# Patient Record
Sex: Male | Born: 1970 | Race: White | Hispanic: No | Marital: Married | Smoking: Former smoker
Health system: Southern US, Community
[De-identification: ages and names within clinical notes are randomized; demographics above are authoritative.]

## PROBLEM LIST (undated history)

## (undated) DIAGNOSIS — F329 Major depressive disorder, single episode, unspecified: Secondary | ICD-10-CM

## (undated) DIAGNOSIS — F32A Depression, unspecified: Secondary | ICD-10-CM

## (undated) DIAGNOSIS — T8484XA Pain due to internal orthopedic prosthetic devices, implants and grafts, initial encounter: Secondary | ICD-10-CM

## (undated) DIAGNOSIS — M109 Gout, unspecified: Secondary | ICD-10-CM

## (undated) DIAGNOSIS — G8929 Other chronic pain: Secondary | ICD-10-CM

## (undated) DIAGNOSIS — R4586 Emotional lability: Secondary | ICD-10-CM

## (undated) DIAGNOSIS — M545 Low back pain, unspecified: Secondary | ICD-10-CM

## (undated) DIAGNOSIS — R748 Abnormal levels of other serum enzymes: Secondary | ICD-10-CM

## (undated) DIAGNOSIS — T40601A Poisoning by unspecified narcotics, accidental (unintentional), initial encounter: Secondary | ICD-10-CM

## (undated) DIAGNOSIS — Z98811 Dental restoration status: Secondary | ICD-10-CM

## (undated) DIAGNOSIS — IMO0002 Reserved for concepts with insufficient information to code with codable children: Secondary | ICD-10-CM

## (undated) DIAGNOSIS — F1010 Alcohol abuse, uncomplicated: Secondary | ICD-10-CM

## (undated) DIAGNOSIS — K219 Gastro-esophageal reflux disease without esophagitis: Secondary | ICD-10-CM

## (undated) HISTORY — DX: Alcohol abuse, uncomplicated: F10.10

## (undated) HISTORY — PX: EYE MUSCLE SURGERY: SHX370

## (undated) HISTORY — DX: Abnormal levels of other serum enzymes: R74.8

## (undated) HISTORY — PX: WISDOM TOOTH EXTRACTION: SHX21

## (undated) HISTORY — DX: Gout, unspecified: M10.9

## (undated) HISTORY — DX: Poisoning by unspecified narcotics, accidental (unintentional), initial encounter: T40.601A

---

## 2003-06-05 ENCOUNTER — Encounter: Admission: RE | Admit: 2003-06-05 | Discharge: 2003-06-05 | Payer: Self-pay | Admitting: Family Medicine

## 2004-09-09 ENCOUNTER — Inpatient Hospital Stay (HOSPITAL_COMMUNITY): Admission: EM | Admit: 2004-09-09 | Discharge: 2004-09-11 | Payer: Self-pay | Admitting: *Deleted

## 2004-10-09 ENCOUNTER — Encounter: Admission: RE | Admit: 2004-10-09 | Discharge: 2004-10-09 | Payer: Self-pay | Admitting: Sports Medicine

## 2004-10-09 ENCOUNTER — Encounter: Admission: RE | Admit: 2004-10-09 | Discharge: 2004-10-09 | Payer: Self-pay | Admitting: Family Medicine

## 2004-11-10 ENCOUNTER — Encounter: Admission: RE | Admit: 2004-11-10 | Discharge: 2004-11-10 | Payer: Self-pay | Admitting: Family Medicine

## 2005-04-02 ENCOUNTER — Emergency Department (HOSPITAL_COMMUNITY): Admission: EM | Admit: 2005-04-02 | Discharge: 2005-04-02 | Payer: Self-pay | Admitting: Emergency Medicine

## 2005-08-23 ENCOUNTER — Emergency Department (HOSPITAL_COMMUNITY): Admission: EM | Admit: 2005-08-23 | Discharge: 2005-08-24 | Payer: Self-pay | Admitting: Emergency Medicine

## 2005-08-26 ENCOUNTER — Encounter: Admission: RE | Admit: 2005-08-26 | Discharge: 2005-08-26 | Payer: Self-pay | Admitting: Family Medicine

## 2005-10-30 ENCOUNTER — Encounter: Admission: RE | Admit: 2005-10-30 | Discharge: 2005-10-30 | Payer: Self-pay | Admitting: Family Medicine

## 2006-11-18 ENCOUNTER — Encounter: Admission: RE | Admit: 2006-11-18 | Discharge: 2006-11-18 | Payer: Self-pay | Admitting: Family Medicine

## 2008-05-16 ENCOUNTER — Encounter
Admission: RE | Admit: 2008-05-16 | Discharge: 2008-08-14 | Payer: Self-pay | Admitting: Physical Medicine & Rehabilitation

## 2008-05-21 ENCOUNTER — Ambulatory Visit: Payer: Self-pay | Admitting: Physical Medicine & Rehabilitation

## 2008-06-25 ENCOUNTER — Ambulatory Visit: Payer: Self-pay | Admitting: Physical Medicine & Rehabilitation

## 2008-07-29 ENCOUNTER — Encounter: Admission: RE | Admit: 2008-07-29 | Discharge: 2008-07-29 | Payer: Self-pay | Admitting: Orthopedic Surgery

## 2008-08-20 ENCOUNTER — Encounter
Admission: RE | Admit: 2008-08-20 | Discharge: 2008-08-20 | Payer: Self-pay | Admitting: Physical Medicine & Rehabilitation

## 2010-07-13 ENCOUNTER — Encounter: Payer: Self-pay | Admitting: Family Medicine

## 2010-08-01 ENCOUNTER — Inpatient Hospital Stay (INDEPENDENT_AMBULATORY_CARE_PROVIDER_SITE_OTHER)
Admission: RE | Admit: 2010-08-01 | Discharge: 2010-08-01 | Disposition: A | Discharge status: Home / Self Care | Payer: Self-pay | Source: Ambulatory Visit | Attending: Family Medicine | Admitting: Family Medicine

## 2010-08-01 ENCOUNTER — Ambulatory Visit (INDEPENDENT_AMBULATORY_CARE_PROVIDER_SITE_OTHER): Payer: Self-pay

## 2010-08-01 DIAGNOSIS — J069 Acute upper respiratory infection, unspecified: Secondary | ICD-10-CM

## 2010-11-04 NOTE — Consult Note (Signed)
REQUESTED BY:  Cristi Loron, MD   Consult requested for evaluation of low back pain.   HISTORY:  A 40 year old male who gives a history of age 48 following  backward while squatting with onset of back pain at that time.  He has  had chiropractic treatments in the past which was not particularly  helpful.  He feels like his pain has gotten worse for the last 1-1/2  years and because of low back pain, he has not exercised as much, he is  not walking as much, and has gained more weight which he feels is  exacerbating his pain complaints.     He had undergone a neurosurgical evaluation in 2007.  An MRI was  reviewed at that time.  He was not felt to be a good surgical candidate.   He states his average pain score is 8-9/10.  Pain is sharp, constant,  interferes with activity at 7/10 level.  His sleep is fair.  Pain is  worse with bending, sitting, and standing.  He really does better if he  can alternate between sitting and standing and his pain improves with  rest and medications.  He is really feeling fair.  He can walk without  assistance close to 5-10 minutes at that time.  He will climb steps and  drives.  He works 40 hours a week in [employer] a month and a half  to two months.  This involves a lot of driving.   He needs some assistance with daily shopping and household duties.   His review of systems is positive for weakness, numbness, as well as  weight gain.   He has been on Vicodin 5 mg every 4-5 hours per his report taking  10/325, which she is written down, although he told me the 5 mg dose.  Dr. Paulino Rily has him on Xanax 0.5 daily, Ambien 10 mg daily, and Prozac  20 mg daily.   His allergies are none.   PAST MEDICAL HISTORY:  He did have an ED visit in 2007 for  unresponsiveness.  EMS was called and he was brought in.  He was on the  verge of intubation when he came to.  He had a urine drug screen  positive for cocaine, positive for ethanol at 185.  He states  that this  was not a recurrent problem and that he was having a hard time with his  mother's death.  He states that he drinks 2-3 times a week, but only has  about 2 drinks at each sitting.  He is married and lives with his wife.  He smokes a pack per day.  We did discuss this as well.   PHYSICAL EXAMINATION:  Blood pressure 140/84, respirations 18, and O2  sat 97% on room air.   Opioid risk total score within 8 putting him at the lower end of high  risk.   Oswestry disability index score 50%.   Examination, obese male in no acute stress.  Orientation x3.  Affect is  alert.  Gait is normal.  He has abnormal pain behaviors grimacing  excessively with changes of position and going from sitting to standing  or from lying to sitting.  Also, pain behaviors when testing is upper  extremity strength, his lower back is hurting him with that.   His upper extremity strength is 5/5, lower extremity strength is 5/5.  Deep tendon reflexes are normal bilaterally upper and lower extremities.  Range of motion is normal  bilateral upper and lower extremities.  Favors  is negative.  Extremities show no evidence of atrophy or fasciculations.  Sensation is normal in the upper and lower extremities.  Neck range of  motion normal.  Back range of motion is abnormal.  He has reduced range  of motion 50% with forward flexion and 50% of normal or extension of his  lumbar spine.   IMPRESSION:  Chronic low back pain likely combination of diskogenic and  some facet arthropathy with some myofascial component as well.   As I indicated with the patient, we will trial him on tramadol given his  past history as well as the need for driving.  Also, I have written for  physical therapy to give him some spine stabilization exercises, to  progress to home exercise program.   Urine drug screen today in the event that we prescribed narcotic  analgesics, I discussed with the patient that he is a high-risk patient  and  would need to be monitored carefully and overall good one to try to  stay away from narcotic analgesics or maybe use them only for a flareup.   Thank you for this interesting consultation.  I will keep you apprised  of his progress.  We discussed injections as well.  He states that he is  afraid of needles, but overall may be part of this treatment.      Erick Colace, M.D.  Electronically Signed     AEK/MedQ  D:05/21/2008 17:01:06  T:05/22/2008 06:35:47  Job #:  [id number]   cc:   Cristi Loron, M.D.  Fax: [phone]   Emeterio Reeve, MD  Fax: [phone]

## 2010-11-04 NOTE — Procedures (Signed)
NAME:  Andre Gutierrez, MCBURNEY NO.:  [id number]   MEDICAL RECORD NO.:  [id number]          PATIENT TYPE:  REC   LOCATION:  TPC                          FACILITY:  [facility]   PHYSICIAN:  Erick Colace, M.D.DATE OF BIRTH:  December 22, 1970   DATE OF PROCEDURE:  06/25/2008  DATE OF DISCHARGE:                               OPERATIVE REPORT   REFERRING PHYSICIAN:  Cristi Loron, MD   A 39 year old male with back pain since age 40 following backward on a  squatting rack.  No relief with chiropractic treatments.  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  Sees Dr. Paulino Rily for his  hypertension and anxiety.   His past medical history also is significant for ED visit in 2007 for  unresponsiveness, EMS called, urine positive for cocaine and ethanol.  Denies any recurrence of any this usage.  He states this was due to his  mother's death.   He has had an early run out on his medications, had about 21 left 6 days  go and has been taking about 4 a day rather than 3 a day as prescribed.  He feels like he has some muscle spasms at night and needs to take 1 at  night for that reason.  He has had no other new medical problems in the  interval time.  He has had a urine drug screen at this office on  May 22, 2008, which was appropriate for the medications that he is  taking.   He still drinks on the weekends and on 2 weekend days a week, he will  drink 3-4 drinks.  I did cautioned him that this can have a negative  interaction in terms of respiratory effect and he was not aware of that.   He has Oswestry disability index score today of 48%.   PHYSICAL EXAMINATION:  GENERAL:  No acute distress.  Mood and affect  appropriate.  Affect is alert.  Gait is normal, although he is stiff  when he first gets up.  VITAL SIGNS:  His blood pressure 165/96, pulse 83, respirations 18, O2  sat 98% room air.  EXTREMITIES:  Without edema.  Deep tendon reflexes are reduced right  ankle, otherwise normal lower extremities.  Lower extremity range of  motion is full.  He has 4/5 strength bilateral quads, 5/5 bilateral  ankle dorsiflexors and plantar flexors.   His extensor strength is reduced secondary to pain.   Extremities showed no fasciculations or atrophy.   IMPRESSION:  Chronic back pain likely combination of diskogenic and  facet arthropathy, myofascial component as well.  We will continue his  hydrocodone.  I reiterated no alcohol with this medication, potential  interaction, and has told him that I do not feel comfortable escalating  this dosage.  For the nighttime muscle spasms, we will start  cyclobenzaprine 10 mg.  I have given some exercises to do for back  stretching.  Also, he is to continue his walking program and followup  with his primary care on weight loss as well as blood pressure.      Greig Castilla  Burman Riis, M.D.  Electronically Signed     AEK/MEDQ  D:  06/25/2008 10:42:15  T:  06/26/2008 00:13:12  Job:  [id number]

## 2010-11-07 NOTE — Discharge Summary (Signed)
NAME:  Andre Gutierrez, BUCHMANN NO.:  [id number]   MEDICAL RECORD NO.:  [id number]          PATIENT TYPE:  INP   LOCATION:  5502                         FACILITY:  [facility]   PHYSICIAN:  Jackie Plum, M.D.DATE OF BIRTH:  April 27, 1971   DATE OF ADMISSION:  09/08/2004  DATE OF DISCHARGE:  09/11/2004                                 DISCHARGE SUMMARY   PRIMARY CARE PHYSICIAN:  Dr. Julian Reil.   DISCHARGE DIAGNOSES:  1.  Community-acquired pneumonia.  2.  History of gastrointestinal reflux disease.  3.  Mild normocytic anemia during hospitalization with an admission      hemoglobin of 13.0 and a discharge hemoglobin of 11.7.  Outpatient      followup recommended.  4.  Mild thrombocytopenia with discharge thrombocyte count of 421 secondary      to infectious disease, i.e. pneumonia.  Outpatient followup recommended.   DISCHARGE MEDICATIONS:  The patient is to go home on:  1.  Zithromax 250 mg daily for two days.  2.  Ceftin 500 mg twice daily for seven days.  3.  Humibid LA 600 mg tablet two tablets twice daily.  4.  Tussionex 5 mL b.i.d.  5.  Protonix 40 mg daily.   DISCHARGE LABORATORIES:  WBC 7.7, hemoglobin 11.7, hematocrit 34.2, MCV  99.7, platelet count 421.  Sodium 137, potassium 3.5, chloride 102, CO2 26,  glucose 103, BUN 3, creatinine 1.0, calcium 8.2.   DIAGNOSTIC WORKUP OF NOTE:  1.  X-ray on admission indicated patchy airspace opacities bilaterally,      mainly on the left, with some of these being vaguely nodular on the      left, which is reminiscent of __________ pneumonia possibly.  (The      patient will need repeat x-ray in about three to four weeks and if this      is persistent, followup CT would need to be done per PCP.)  2.  Urinary Legionella antigen done on September 09, 2004, and reported on September 10, 2004, was negative.  Blood cultures taken on September 08, 2004, and      reported on September 10, 2004, had not grown any organism yet.  This is a  preliminary report.  (This will need to be followed up with final report      at outpatient level.)   CONSULTANTS:  Not applicable.   PROCEDURES:  Not applicable.   CONDITION ON DISCHARGE:  Improved and satisfactory.   REASON FOR ADMISSION/HOSPITAL COURSE:  The patient presented with weakness,  fatigue, cough, fever and chills of two weeks' duration.  He had been given  Tamiflu and antibiotic for 10 days without any improvement.  On presentation  to the emergency room, he had not been able to eat well with decreased  appetite.  According to the H&P by Theone Stanley, M.D., on presentation,  the patient's temperature was 99.2 degrees Fahrenheit initially, but had  gone up to 102.1 degrees Fahrenheit.  His pulse rate was 101 with a  respiratory rate of 20 and O2 saturation of 97% on 3 L by oxygen by  nasal  cannula.  He was said to be ill appearing and his eyes were somewhat sunken,  but he was not in any acute distress.  Cardiac exam was unremarkable.  Lung  auscultation revealed mild expiratory wheezes mainly on the right side.  His  neurologic exam was unremarkable.  He had a leukocytosis of 11,100 with  normal hemoglobin and hematocrit.  X-rays showed ill-defined airspace  opacities as mentioned above.  He was therefore admitted for community-  acquired pneumonia with outpatient failed treatment and started on  antibiotics for this.  The patient was admitted to the hospitalist service.  IV antibiotics were started on Zithromax and Rocephin.  The patient had  apparently been treated with Levaquin previously and failed this treatment.  He was started on nebulizers and antitussives with expectorants.  With these  measures, the patient's overall clinical condition improved.  He felt  stronger.  Had been able to ambulate in his room and on the unit without any  problems.  His O2 saturation done this afternoon on room air was 92% with  ambulation .  He had been afebrile for two days.  He  is deemed appropriate  for discharge today.  On rounds today, the patient is hemodynamically  stable.  His mucous membranes are moist.  He is not in any acute  cardiopulmonary distress.  His lung exam is improved with improved air entry  and no wheezes.  He had scattered rhonchi only.  Cardiac exam unremarkable,  regular, no gallops.  Abdomen soft and nontender.  Extremities with no  cyanosis and no edema.  CNS exam is nonfocal.  Laboratory work as listed  above.  He is going to be discharged home to complete antibiotic treatment  as noted above.  He will need followup with his PCP, at which time he will  need repeat imaging as noted above.   This afternoon, I discussed the patient's plan of care with the patient's  spouse at bedside.  All of their questions were satisfactorily answered and  he is good to go.      GO/MEDQ  D:  09/11/2004  T:  09/11/2004  Job:  [id number]   cc:   Dr. Julian Reil

## 2010-11-07 NOTE — H&P (Signed)
NAME:  Andre Gutierrez, Andre Gutierrez NO.:  [id number]   MEDICAL RECORD NO.:  [id number]          PATIENT TYPE:  EMS   LOCATION:  [facility]JO                         FACILITY:  [facility]   PHYSICIAN:  Theone Stanley, MD   DATE OF BIRTH:  1971-01-19   DATE OF ADMISSION:  09/08/2004  DATE OF DISCHARGE:                                HISTORY & PHYSICAL   CHIEF COMPLAINT:  Weakness.   HISTORY OF PRESENT ILLNESS:  Andre Gutierrez is a very pleasant 40 year old  gentleman presenting with continued symptoms of fatigue, intermittent cough,  fever and chills over the past two weeks.  His history starts about two  weeks ago.  He had gone to [place] at that time, and he stated that it was  cold and he subsequently went in the hot tub, and he felt that he might have  gone to sleep wet.  He woke up on Saturday feeling quite ill.  At that point  in time both he and his girlfriend were feeling bad.  They were able to see  Andre Gutierrez, their physician.  Although he was negative for flu, his  girlfriend was positive for flu so he was treated both for flu and  pneumonia.  He was given Tamiflu and some antibiotics for a 10-day course.  It is unclear what time.  It has been related to me that he was on Levaquin.  He states that his cough, which was quite severe originally, has now  somewhat subsided and mainly it is okay when he is lying still.  It is a dry  cough, nonproductive.  He does have fever and chills, increasing shortness  of breath with movement, no dizziness.  He has been unable to eat.  He has  had nausea and vomiting with his oral intake; however, he was given some  medication for that and he is able to keep stuff down currently.  He denies  any change in his urinating or bowel habits.  No weakness, no headache, no  lightheadedness, no dizziness.  No intolerance to heat or cold.   PAST MEDICAL HISTORY:  1.  Mainly pains.  He has chronic knee pain and chronic shoulder pain.  2.  GERD.   MEDICATIONS:  1.  Aciphex daily.  2.  Vicodin on a p.r.n. basis.  He, however, has been taking it quite      frequently recently, up to six to eight days a day, and that is 10/500      mg.  3.  Tussionex.  Again, he is taking this quite regularly; now he just takes      it at night.  4.  Xanax is on a p.r.n. basis.   ALLERGIES:  No known drug allergies.   SOCIAL HISTORY:  The patient lives in [place].  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  He is single, no children.  Smokes a pack a day for the past  year and a half.  Prior to that he was chewing snuff, but he has not been  doing that for quite some time.  He drinks six to eight drinks  a day of  wine, liquor and sometimes beer.  He does not feel this is in excess.  No  illicit drug use.   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   FAMILY HISTORY:  None.   REVIEW OF SYSTEMS:  Pleas see HPI, otherwise all systems are negative.   PHYSICAL EXAMINATION:  VITAL SIGNS:  Temperature originally 99.2.  He did  have a fever of 102.1.  Blood pressure 112/60, pulse of 101, respiratory  rate of 20.  While he has oxygen on at about 2-3 L, he is 97%; however, when  he takes it off he goes into the mid-80s and he drops it even further when  he ambulates.  GENERAL:  Andre Gutierrez is a very pleasant 40 year old who does appear ill.  His eyes are somewhat sunken in, although he was not in any distress.  HEENT:  Head normocephalic, atraumatic.  Eyes:  Pupils equal, reactive to  light.  Extraocular movements intact.  Ears with no discharge.  Throat:  Clear.  Nasal mucosa moist.  NECK:  Supple, no lymphadenopathy.  CARDIAC:  Regular rate and rhythm.  No murmurs, rubs or gallops appreciated.  LUNGS:  Clear.  He had mild expiratory wheezing mainly on the right side,  otherwise I did not appreciate any abnormalities.  ABDOMEN:  Soft, nontender.  EXTREMITIES:  No edema, cyanosis or clubbing.  GENITOURINARY:   Deferred.  SKIN:  Skin appeared to be warm.  He was clammy and he had sweat on his  back, otherwise no rashes appreciated.  NEUROLOGIC:  The patient is alert and oriented x3.   LABORATORY DATA:  White count of 11, hemoglobin of 13, hematocrit of 38.  Platelets at 422.  Sodium 132, potassium 3.7, chloride at 98, BUN at 12,  creatinine at 1.6.  Chest x-ray showed ill-defined air space opacities in  the left lower lung consistent with pneumonia, consider atypical.  The  patient had two blood cultures drawn.  These are pending.   ASSESSMENT AND PLAN:  A 40 year old gentleman presenting with pneumonia, who  has failed outpatient treatment.   1.  Pneumonia.  This could be atypical; however, if he was on Levaquin, this      should have covered atypical.  The question is whether he might have a      resistant Streptococcus or some other bacteria that Levaquin was not      covering.  Will put him on azithromycin and Rocephin at this point in      time, follow up on his blood cultures, and I will obtain a Legionella      urine antigen assay.  2.  Hypoxia.  This is secondary to #1.  He will need to be on oxygen, and      there is a possibility he will need to be discharged on this.  Will ask      case management to get involved with that.  3.  Chronic pains.  The patient apparently has chronic pain issues in his      knees and his shoulder.  He normally takes Vicodin at home.  However, to      see whether his fevers are defervescing, will switch him to oxycodone      and Tylenol on a p.r.n. basis so that we can monitor this.  4.  Gastroesophageal reflux disease.  Will continue on Protonix 40 mg p.o.      daily.  5.  Prophylaxis.  He will be on a PPI, and we will start him on Lovenox.      AEJ/MEDQ  D:  09/09/2004  T:  09/09/2004  Job:  [id number]   cc:   Andre Gutierrez, M.D.  [address]  [place], [place] [address]  Fax: [phone]

## 2010-12-12 ENCOUNTER — Emergency Department (HOSPITAL_COMMUNITY)
Admission: EM | Admit: 2010-12-12 | Discharge: 2010-12-12 | Disposition: A | Discharge status: Home / Self Care | Payer: Self-pay | Attending: Emergency Medicine | Admitting: Emergency Medicine

## 2010-12-12 DIAGNOSIS — H5710 Ocular pain, unspecified eye: Secondary | ICD-10-CM | POA: Insufficient documentation

## 2010-12-12 DIAGNOSIS — H109 Unspecified conjunctivitis: Secondary | ICD-10-CM | POA: Insufficient documentation

## 2011-01-29 ENCOUNTER — Emergency Department (HOSPITAL_COMMUNITY)
Admission: EM | Admit: 2011-01-29 | Discharge: 2011-01-30 | Disposition: A | Discharge status: Home / Self Care | Payer: Self-pay | Attending: Emergency Medicine | Admitting: Emergency Medicine

## 2011-01-29 ENCOUNTER — Emergency Department (HOSPITAL_COMMUNITY): Payer: Self-pay

## 2011-01-29 DIAGNOSIS — S91309A Unspecified open wound, unspecified foot, initial encounter: Secondary | ICD-10-CM | POA: Insufficient documentation

## 2011-01-29 DIAGNOSIS — Z09 Encounter for follow-up examination after completed treatment for conditions other than malignant neoplasm: Secondary | ICD-10-CM | POA: Insufficient documentation

## 2011-01-29 DIAGNOSIS — W268XXA Contact with other sharp object(s), not elsewhere classified, initial encounter: Secondary | ICD-10-CM | POA: Insufficient documentation

## 2011-12-23 ENCOUNTER — Emergency Department (HOSPITAL_COMMUNITY)
Admission: EM | Admit: 2011-12-23 | Discharge: 2011-12-23 | Disposition: A | Discharge status: Rehabilitation Facility | Payer: Self-pay | Attending: Emergency Medicine | Admitting: Emergency Medicine

## 2011-12-23 ENCOUNTER — Encounter (HOSPITAL_COMMUNITY): Payer: Self-pay | Admitting: Emergency Medicine

## 2011-12-23 DIAGNOSIS — F1010 Alcohol abuse, uncomplicated: Secondary | ICD-10-CM | POA: Insufficient documentation

## 2011-12-23 LAB — URINALYSIS, ROUTINE W REFLEX MICROSCOPIC
Glucose, UA: NEGATIVE mg/dL
Hgb urine dipstick: NEGATIVE
Ketones, ur: 40 mg/dL — AB
Nitrite: NEGATIVE
Protein, ur: 100 mg/dL — AB
Specific Gravity, Urine: >1.046 — ABNORMAL HIGH (ref 1.005–1.030)
Urobilinogen, UA: 1 mg/dL (ref 0.0–1.0)
pH: 5.5 (ref 5.0–8.0)

## 2011-12-23 LAB — CBC WITH DIFFERENTIAL/PLATELET
Basophils Absolute: 0 10*3/uL (ref 0.0–0.1)
Basophils Relative: 0 % (ref 0–1)
Eosinophils Absolute: 0 10*3/uL (ref 0.0–0.7)
Eosinophils Relative: 0 % (ref 0–5)
HCT: 44.4 % (ref 39.0–52.0)
Hemoglobin: 15.9 g/dL (ref 13.0–17.0)
Lymphocytes Relative: 34 % (ref 12–46)
Lymphs Abs: 1.7 10*3/uL (ref 0.7–4.0)
MCH: 35.8 pg — ABNORMAL HIGH (ref 26.0–34.0)
MCHC: 35.8 g/dL (ref 30.0–36.0)
MCV: 100 fL (ref 78.0–100.0)
Monocytes Absolute: 0.5 10*3/uL (ref 0.1–1.0)
Monocytes Relative: 9 % (ref 3–12)
Neutro Abs: 2.8 10*3/uL (ref 1.7–7.7)
Neutrophils Relative %: 56 % (ref 43–77)
Platelets: 110 10*3/uL — ABNORMAL LOW (ref 150–400)
RBC: 4.44 MIL/uL (ref 4.22–5.81)
RDW: 14.7 % (ref 11.5–15.5)
WBC: 4.9 10*3/uL (ref 4.0–10.5)

## 2011-12-23 LAB — URINE MICROSCOPIC-ADD ON

## 2011-12-23 LAB — COMPREHENSIVE METABOLIC PANEL
ALT: 88 U/L — ABNORMAL HIGH (ref 0–53)
AST: 124 U/L — ABNORMAL HIGH (ref 0–37)
Albumin: 4.6 g/dL (ref 3.5–5.2)
Alkaline Phosphatase: 95 U/L (ref 39–117)
BUN: 9 mg/dL (ref 6–23)
CO2: 21 mEq/L (ref 19–32)
Calcium: 9.8 mg/dL (ref 8.4–10.5)
Chloride: 97 mEq/L (ref 96–112)
Creatinine, Ser: 0.86 mg/dL (ref 0.50–1.35)
GFR calc Af Amer: >90 mL/min (ref >90)
GFR calc non Af Amer: >90 mL/min (ref >90)
Glucose, Bld: 125 mg/dL — ABNORMAL HIGH (ref 70–99)
Potassium: 3.4 mEq/L — ABNORMAL LOW (ref 3.5–5.1)
Sodium: 137 mEq/L (ref 135–145)
Total Bilirubin: 1.2 mg/dL (ref 0.3–1.2)
Total Protein: 8 g/dL (ref 6.0–8.3)

## 2011-12-23 LAB — RAPID URINE DRUG SCREEN, HOSP PERFORMED
Amphetamines: NOT DETECTED
Barbiturates: NOT DETECTED
Benzodiazepines: POSITIVE — AB
Cocaine: NOT DETECTED
Opiates: NOT DETECTED
Tetrahydrocannabinol: NOT DETECTED

## 2011-12-23 LAB — ETHANOL: Alcohol, Ethyl (B): 16 mg/dL — ABNORMAL HIGH (ref 0–11)

## 2011-12-23 MED ORDER — IBUPROFEN 600 MG PO TABS: 600.0000 mg | ORAL_TABLET | Freq: Three times a day (TID) | ORAL | Status: DC | PRN

## 2011-12-23 MED ORDER — THIAMINE HCL 100 MG/ML IJ SOLN: 100.0000 mg | Freq: Every day | INTRAMUSCULAR | Status: DC

## 2011-12-23 MED ORDER — NICOTINE 21 MG/24HR TD PT24: 21.0000 mg | MEDICATED_PATCH | Freq: Every day | TRANSDERMAL | Status: DC

## 2011-12-23 MED ORDER — ADULT MULTIVITAMIN W/MINERALS CH
1.0000 | ORAL_TABLET | Freq: Every day | ORAL | Status: DC
  Administered 2011-12-23: 1 via ORAL
  Filled 2011-12-23: qty 1

## 2011-12-23 MED ORDER — LORAZEPAM 1 MG PO TABS
0.0000 mg | ORAL_TABLET | Freq: Four times a day (QID) | ORAL | Status: DC
  Administered 2011-12-23: 2 mg via ORAL
  Administered 2011-12-23: 1 mg via ORAL
  Filled 2011-12-23 (×2): qty 1
  Filled 2011-12-23: qty 2

## 2011-12-23 MED ORDER — POTASSIUM CHLORIDE CRYS ER 20 MEQ PO TBCR
20.0000 meq | EXTENDED_RELEASE_TABLET | Freq: Once | ORAL | Status: AC
  Administered 2011-12-23: 20 meq via ORAL
  Filled 2011-12-23: qty 1

## 2011-12-23 MED ORDER — FOLIC ACID 1 MG PO TABS
1.0000 mg | ORAL_TABLET | Freq: Every day | ORAL | Status: DC
  Administered 2011-12-23: 1 mg via ORAL
  Filled 2011-12-23: qty 1

## 2011-12-23 MED ORDER — ZOLPIDEM TARTRATE 5 MG PO TABS: 5.0000 mg | ORAL_TABLET | Freq: Every evening | ORAL | Status: DC | PRN

## 2011-12-23 MED ORDER — LORAZEPAM 1 MG PO TABS: 1.0000 mg | ORAL_TABLET | Freq: Four times a day (QID) | ORAL | Status: DC | PRN

## 2011-12-23 MED ORDER — LORAZEPAM 2 MG/ML IJ SOLN: 1.0000 mg | Freq: Four times a day (QID) | INTRAMUSCULAR | Status: DC | PRN

## 2011-12-23 MED ORDER — LORAZEPAM 1 MG PO TABS: 0.0000 mg | ORAL_TABLET | Freq: Two times a day (BID) | ORAL | Status: DC

## 2011-12-23 MED ORDER — ALUM & MAG HYDROXIDE-SIMETH 200-200-20 MG/5ML PO SUSP: 30.0000 mL | ORAL | Status: DC | PRN

## 2011-12-23 MED ORDER — ACETAMINOPHEN 325 MG PO TABS: 650.0000 mg | ORAL_TABLET | ORAL | Status: DC | PRN

## 2011-12-23 MED ORDER — VITAMIN B-1 100 MG PO TABS
100.0000 mg | ORAL_TABLET | Freq: Every day | ORAL | Status: DC
  Administered 2011-12-23: 100 mg via ORAL
  Filled 2011-12-23: qty 1

## 2011-12-23 MED ORDER — ONDANSETRON HCL 4 MG PO TABS: 4.0000 mg | ORAL_TABLET | Freq: Three times a day (TID) | ORAL | Status: DC | PRN

## 2011-12-23 NOTE — ED Notes (Signed)
Pt states he was sent her by [facility] for med clearance.  Wants detox from alcohol.  Denies SI/HI/AVH.

## 2011-12-23 NOTE — ED Notes (Addendum)
[facility] has confirmed the availability of a bed for this pt at [facility]. Pt discharged to company of wife and is reportedly going to [facility] now. Departs in safe and stable condition.

## 2011-12-23 NOTE — ED Notes (Signed)
1 pt belonging bag in locker 43

## 2011-12-23 NOTE — ED Provider Notes (Signed)
History     CSN: [id number]  Arrival date & time 12/23/11  1059   First MD Initiated Contact with Patient 12/23/11 1101      No chief complaint on file.   (Consider location/radiation/quality/duration/timing/severity/associated sxs/prior treatment) HPI   Pt  Sent from [facility] for detox from alcohol. He was told he could go to his PCP or come to [facility] to have medical clearance. He states that he's been drinking since he was 41 years old and has tried detox before that accidentally went back to have it. The patient is also on Suboxone for opiate abuse and chronic pain. The patient admits to feeling very shaky and I am visibly able to see him tremor. He denies having any delusions, depression, suicidal ideation, homicidal ideation. The patient states that [facility]  told him that they have a bed for him. The patient's a little tachycardic at 103 and blood pressure is 172/102. He is currently having some mild tremors. He is in no acute distress at this time.   Past Medical History  Diagnosis Date  . ETOH abuse     Past Surgical History  Procedure Date  . Eye surgery     No family history on file.  History  Substance Use Topics  . Smoking status: Never Smoker   . Smokeless tobacco: Current User  . Alcohol Use: Yes      Review of Systems   HEENT: denies blurry vision or change in hearing PULMONARY: Denies difficulty breathing and SOB CARDIAC: denies chest pain or heart palpitations MUSCULOSKELETAL:  denies being unable to ambulate ABDOMEN AL: denies abdominal pain GU: denies loss of bowel or urinary control NEURO: denies numbness and tingling in extremities SKIN: no new rashes PSYCH: patient denies anxiety or depression. NECK: Pt denies having neck pain    Allergies  Review of patient's allergies indicates no known allergies.  Home Medications  No current outpatient prescriptions on file.  BP 172/102  Pulse 103  Temp 98.2 F (36.8 C) (Oral)  Resp 22  Ht 6\' 1"   (1.854 m)  Wt 275 lb (124.739 kg)  BMI 36.28 kg/m2  SpO2 98%  Physical Exam  Nursing note and vitals reviewed. Constitutional: He appears well-developed and well-nourished. No distress.  HENT:  Head: Normocephalic and atraumatic.  Eyes: Pupils are equal, round, and reactive to light.  Neck: Normal range of motion. Neck supple.  Cardiovascular: Normal rate and regular rhythm.   Pulmonary/Chest: Effort normal.  Abdominal: Soft.  Neurological: He is alert.  Skin: Skin is warm and dry.  Psychiatric: He has a normal mood and affect. His speech is normal. He is not aggressive and not withdrawn. Thought content is not paranoid and not delusional. He expresses no homicidal ideation. He expresses no homicidal plans.       pts is having tremors from alcohol withdrawals    ED Course  Procedures (including critical care time)   Labs Reviewed  URINE RAPID DRUG SCREEN (HOSP PERFORMED)  URINALYSIS, ROUTINE W REFLEX MICROSCOPIC  ETHANOL  CBC WITH DIFFERENTIAL  COMPREHENSIVE METABOLIC PANEL   No results found.   1. Alcohol abuse       MDM  Pt given 2mg  Ativan in ED per Alcohol withdrawal protocol.    [facility] will be consulted.      Dorthula Matas, PA 12/23/11 1118

## 2011-12-23 NOTE — ED Notes (Signed)
Patient's wife brought patient a bag of clothes. Clothes placed in locker 43.

## 2011-12-23 NOTE — ED Notes (Signed)
Pt wanded by security 1 bag of belongings. [employer]. $21.00 cash and 1 apple phone. t-shirt, shorts, shoes.

## 2011-12-23 NOTE — ED Notes (Signed)
Patient aware of need for urine specimen. Patient unable to void at this time. Patient given label specimen cup. Encouraged to call for assistance if needed.  Wants a cup of water.

## 2011-12-23 NOTE — ED Provider Notes (Signed)
History     CSN: [id number]  Arrival date & time 12/23/11  1059   First MD Initiated Contact with Patient 12/23/11 1101      No chief complaint on file.   (Consider location/radiation/quality/duration/timing/severity/associated sxs/prior treatment) HPI  Past Medical History  Diagnosis Date  . ETOH abuse     Past Surgical History  Procedure Date  . Eye surgery     No family history on file.  History  Substance Use Topics  . Smoking status: Never Smoker   . Smokeless tobacco: Current User  . Alcohol Use: Yes      Review of Systems  Allergies  Review of patient's allergies indicates no known allergies.  Home Medications   Current Outpatient Rx  Name Route Sig Dispense Refill  . BUPRENORPHINE HCL-NALOXONE HCL 8-2 MG SL FILM Sublingual Place 4 Film under the tongue 2 (two) times daily.    Marland Kitchen FLUOXETINE HCL 20 MG PO CAPS Oral Take 20 mg by mouth every morning.      BP 172/102  Pulse 102  Temp 98.2 F (36.8 C) (Oral)  Resp 22  Ht 6\' 1"  (1.854 m)  Wt 275 lb (124.739 kg)  BMI 36.28 kg/m2  SpO2 98%  Physical Exam  ED Course  Procedures (including critical care time)  Labs Reviewed  URINE RAPID DRUG SCREEN (HOSP PERFORMED) - Abnormal; Notable for the following:    Benzodiazepines POSITIVE (*)     All other components within normal limits  URINALYSIS, ROUTINE W REFLEX MICROSCOPIC - Abnormal; Notable for the following:    Color, Urine ORANGE (*)  BIOCHEMICALS MAY BE AFFECTED BY COLOR   Specific Gravity, Urine >1.046 (*)     Bilirubin Urine SMALL (*)     Ketones, ur 40 (*)     Protein, ur 100 (*)     Leukocytes, UA SMALL (*)     All other components within normal limits  ETHANOL - Abnormal; Notable for the following:    Alcohol, Ethyl (B) 16 (*)     All other components within normal limits  CBC WITH DIFFERENTIAL - Abnormal; Notable for the following:    MCH 35.8 (*)     Platelets 110 (*)     All other components within normal limits  COMPREHENSIVE  METABOLIC PANEL - Abnormal; Notable for the following:    Potassium 3.4 (*)     Glucose, Bld 125 (*)     AST 124 (*)     ALT 88 (*)     All other components within normal limits  URINE MICROSCOPIC-ADD ON - Abnormal; Notable for the following:    Squamous Epithelial / LPF FEW (*)     Bacteria, UA FEW (*)     All other components within normal limits   No results found.   1. Alcohol abuse       MDM  Medical screening examination/treatment/procedure(s) were conducted as a shared visit with non-physician practitioner(s) and myself.  I personally evaluated the patient during the encounter Pt with etoh abuse, requesting detox/rehab program. Alert, content. No tremor or shakes. Act eval.         Suzi Roots, MD 12/23/11 [id number]

## 2011-12-23 NOTE — ED Provider Notes (Signed)
Medical screening examination/treatment/procedure(s) were conducted as a shared visit with non-physician practitioner(s) and myself.  I personally evaluated the patient during the encounter   Suzi Roots, MD 12/23/11 1537

## 2011-12-23 NOTE — ED Provider Notes (Signed)
  Physical Exam  BP 142/98  Pulse 96  Temp 98 F (36.7 C) (Oral)  Resp 18  Ht 6\' 1"  (1.854 m)  Wt 275 lb (124.739 kg)  BMI 36.28 kg/m2  SpO2 97%  Physical Exam  ED Course  Procedures  MDM Patient has been accepted at [facility] and his wife is coming to pick him up.      Juliet Rude. Rubin Payor, MD 12/23/11 2047

## 2011-12-23 NOTE — ED Notes (Signed)
Family at bedside. 

## 2011-12-23 NOTE — BH Assessment (Signed)
Assessment Note   Andre Gutierrez is an 41 y.o. male. Requesting detox from ETOH. Reports drinking approx 1/5 liquor daily since March 2013 with slight increase at the last several months. Denies other SA. Denies SI, HI or psychosis. States he is on Suboxone maintenance for former opiate abuse & chronic pain and is followed by Dr. Dub Mikes for this.. Stated was at [facility] July 2012 and had one other relapse during that time. Pt stated last drink was approx 9AM 12/23/11. Reports anxiety, sweats, tremors and a tendency for his blood pressure to increase with detox.  Axis I: Alcohol Dependence Axis II: Deferred Axis III:  Past Medical History  Diagnosis Date  . ETOH abuse    Axis IV: occupational problems, other psychosocial or environmental problems and problems with primary support group Axis V: 41-50 serious symptoms  Past Medical History:  Past Medical History  Diagnosis Date  . ETOH abuse     Past Surgical History  Procedure Date  . Eye surgery     Family History: No family history on file.  Social History:  reports that he has never smoked. He uses smokeless tobacco. He reports that he drinks alcohol. His drug history not on file.  Additional Social History:  Alcohol / Drug Use Pain Medications: N/A Prescriptions: See PTA List Over the Counter: Benadryl History of alcohol / drug use?: Yes Longest period of sobriety (when/how long): 1 moth Withdrawal Symptoms: Agitation;Change in blood pressure;Fever / Chills;Nausea / Vomiting;Tremors Substance #1 Name of Substance 1: ETOH 1 - Age of First Use: 17 1 - Amount (size/oz): 1/5 liquor 1 - Frequency: daily 1 - Duration: since March 2013 1 - Last Use / Amount: 9 AM  CIWA: CIWA-Ar BP: 172/102 mmHg Pulse Rate: 102  Nausea and Vomiting: mild nausea with no vomiting Tactile Disturbances: none Tremor: two Auditory Disturbances: not present Paroxysmal Sweats: two Visual Disturbances: very mild sensitivity Anxiety: two Headache,  Fullness in Head: none present Agitation: somewhat more than normal activity Orientation and Clouding of Sensorium: oriented and can do serial additions CIWA-Ar Total: 5  COWS:    Allergies: No Known Allergies  Home Medications:  (Not in a hospital admission)  OB/GYN Status:  No LMP for male patient.  General Assessment Data Location of Assessment: [facility] ED Living Arrangements: Alone Can pt return to current living arrangement?: Yes Admission Status: Voluntary Is patient capable of signing voluntary admission?: Yes Transfer from: Acute Hospital Referral Source: Self/Family/Friend  Education Status Is patient currently in school?: No Highest grade of school patient has completed: College  Risk to self Suicidal Ideation: No Suicidal Intent: No Is patient at risk for suicide?: No Suicidal Plan?: No Access to Means: No What has been your use of drugs/alcohol within the last 12 months?: 1/5 liquor daily since March 2013 Previous Attempts/Gestures: No How many times?: 0  Other Self Harm Risks: N.A Intentional Self Injurious Behavior: None Family Suicide History: Unknown Recent stressful life event(s): Other (Comment);Recent negative physical changes (Waking up shaking & becoming more dependent on ETOH) Persecutory voices/beliefs?: No Depression: No Depression Symptoms: Insomnia Substance abuse history and/or treatment for substance abuse?: Yes Suicide prevention information given to non-admitted patients: Not applicable  Risk to Others Homicidal Ideation: No Thoughts of Harm to Others: No Current Homicidal Intent: No Current Homicidal Plan: No Access to Homicidal Means: No Identified Victim: N/A History of harm to others?: No Assessment of Violence: None Noted Violent Behavior Description: Anxious, cooperative Does patient have access to weapons?: No Criminal Charges  Pending?: No Does patient have a court date: No  Psychosis Hallucinations: None noted Delusions:  None noted  Mental Status Report Appear/Hygiene: Other (Comment) (unremarkable) Eye Contact: Good Motor Activity: Restlessness;Freedom of movement Speech: Logical/coherent Level of Consciousness: Alert Mood: Anxious Affect: Anxious Anxiety Level: Minimal Thought Processes: Coherent;Relevant Judgement: Unimpaired Orientation: Person;Place;Time;Situation Obsessive Compulsive Thoughts/Behaviors: None  Cognitive Functioning Concentration: Normal Memory: Recent Intact;Remote Intact IQ: Average Insight: Good Impulse Control: Fair Appetite: Poor Weight Loss: 0  Weight Gain: 0  Sleep: Decreased Total Hours of Sleep: 4  (will take OTC to help sleep) Vegetative Symptoms: None  ADLScreening [facility] Assessment Services) Patient's cognitive ability adequate to safely complete daily activities?: Yes Patient able to express need for assistance with ADLs?: Yes Independently performs ADLs?: Yes  Abuse/Neglect [facility]) Physical Abuse: Denies Verbal Abuse: Denies Sexual Abuse: Denies  Prior Inpatient Therapy Prior Inpatient Therapy: Yes Prior Therapy Dates: July 2012 Prior Therapy Facilty/Provider(s): [facility] Reason for Treatment: Detox  Prior Outpatient Therapy Prior Outpatient Therapy: Yes Prior Therapy Dates: Current Prior Therapy Facilty/Provider(s): Dr. Dub Mikes for suboxone maintenance/checks Reason for Treatment: Suboxone  ADL Screening (condition at time of admission) Patient's cognitive ability adequate to safely complete daily activities?: Yes Patient able to express need for assistance with ADLs?: Yes Independently performs ADLs?: Yes Weakness of Legs: None Weakness of Arms/Hands: None       Abuse/Neglect Assessment (Assessment to be complete while patient is alone) Physical Abuse: Denies Verbal Abuse: Denies Sexual Abuse: Denies Exploitation of patient/patient's resources: Denies Self-Neglect: Denies Values / Beliefs Cultural Requests During Hospitalization:  None Spiritual Requests During Hospitalization: None   Advance Directives (For Healthcare) Advance Directive: Patient does not have advance directive;Patient would not like information Pre-existing out of facility DNR order (yellow form or pink MOST form): No    Additional Information 1:1 In Past 12 Months?: No CIRT Risk: No Elopement Risk: No Does patient have medical clearance?: Yes     Disposition:  Disposition Disposition of Patient: Referred to [facility]) Patient referred to: [facility]  On Site Evaluation by:   Reviewed with Physician:     Romeo Apple 12/23/2011 6:03 PM

## 2011-12-23 NOTE — ED Notes (Signed)
Pt called [facility] this morning for detox from alcohol-Vodka. Pt has been treated before and says "it has been a roller coaster". Today pt is anxious and shaking. Pt reports last drink this am. Pt denies SI/HI.

## 2012-07-22 ENCOUNTER — Other Ambulatory Visit: Payer: Self-pay | Admitting: Orthopedic Surgery

## 2012-07-22 DIAGNOSIS — M79671 Pain in right foot: Secondary | ICD-10-CM

## 2012-07-25 ENCOUNTER — Other Ambulatory Visit: Payer: Self-pay | Admitting: Orthopedic Surgery

## 2012-07-25 ENCOUNTER — Ambulatory Visit
Admission: RE | Admit: 2012-07-25 | Discharge: 2012-07-25 | Disposition: A | Discharge status: Home / Self Care | Payer: BC Managed Care – PPO | Source: Ambulatory Visit | Attending: Orthopedic Surgery | Admitting: Orthopedic Surgery

## 2012-07-25 DIAGNOSIS — M79671 Pain in right foot: Secondary | ICD-10-CM

## 2012-08-23 ENCOUNTER — Encounter (HOSPITAL_BASED_OUTPATIENT_CLINIC_OR_DEPARTMENT_OTHER): Payer: Self-pay | Admitting: *Deleted

## 2012-08-23 NOTE — Progress Notes (Signed)
Pt had hx etoh abuse-denies now-hx gerd-no meds-recent physical-called for notes-probably has sleep apnea-no study-staying overnight-to bring all meds and overnight bag

## 2012-08-23 NOTE — Progress Notes (Signed)
08/23/12 1307  OBSTRUCTIVE SLEEP APNEA  Have you ever been diagnosed with sleep apnea through a sleep study? No

## 2012-08-24 ENCOUNTER — Other Ambulatory Visit: Payer: Self-pay | Admitting: Orthopedic Surgery

## 2012-08-25 ENCOUNTER — Ambulatory Visit (HOSPITAL_COMMUNITY): Payer: BC Managed Care – PPO

## 2012-08-25 ENCOUNTER — Encounter (HOSPITAL_BASED_OUTPATIENT_CLINIC_OR_DEPARTMENT_OTHER): Payer: Self-pay | Admitting: Certified Registered Nurse Anesthetist

## 2012-08-25 ENCOUNTER — Ambulatory Visit (HOSPITAL_BASED_OUTPATIENT_CLINIC_OR_DEPARTMENT_OTHER)
Admission: RE | Admit: 2012-08-25 | Discharge: 2012-08-25 | Disposition: A | Discharge status: Home / Self Care | Payer: BC Managed Care – PPO | Source: Ambulatory Visit | Attending: Orthopedic Surgery | Admitting: Orthopedic Surgery

## 2012-08-25 ENCOUNTER — Ambulatory Visit (HOSPITAL_BASED_OUTPATIENT_CLINIC_OR_DEPARTMENT_OTHER): Payer: BC Managed Care – PPO | Admitting: Certified Registered Nurse Anesthetist

## 2012-08-25 ENCOUNTER — Encounter (HOSPITAL_BASED_OUTPATIENT_CLINIC_OR_DEPARTMENT_OTHER)
Admission: RE | Disposition: A | Discharge status: Home / Self Care | Payer: Self-pay | Source: Ambulatory Visit | Attending: Orthopedic Surgery

## 2012-08-25 DIAGNOSIS — M12579 Traumatic arthropathy, unspecified ankle and foot: Secondary | ICD-10-CM | POA: Insufficient documentation

## 2012-08-25 DIAGNOSIS — M6240 Contracture of muscle, unspecified site: Secondary | ICD-10-CM | POA: Insufficient documentation

## 2012-08-25 DIAGNOSIS — S92251K Displaced fracture of navicular [scaphoid] of right foot, subsequent encounter for fracture with nonunion: Secondary | ICD-10-CM

## 2012-08-25 DIAGNOSIS — IMO0002 Reserved for concepts with insufficient information to code with codable children: Secondary | ICD-10-CM | POA: Insufficient documentation

## 2012-08-25 HISTORY — PX: GASTROC RECESSION EXTREMITY: SHX6262

## 2012-08-25 HISTORY — PX: FOOT ARTHRODESIS: SHX1655

## 2012-08-25 HISTORY — DX: Gastro-esophageal reflux disease without esophagitis: K21.9

## 2012-08-25 HISTORY — DX: Major depressive disorder, single episode, unspecified: F32.9

## 2012-08-25 HISTORY — DX: Depression, unspecified: F32.A

## 2012-08-25 SURGERY — FUSION, JOINT, FOOT
Anesthesia: Regional | Site: Leg Lower | Laterality: Right | Wound class: Clean

## 2012-08-25 MED ORDER — OXYCODONE HCL 5 MG PO TABS
5.0000 mg | ORAL_TABLET | Freq: Once | ORAL | Status: AC | PRN
  Administered 2012-08-25: 5 mg via ORAL

## 2012-08-25 MED ORDER — BACITRACIN ZINC 500 UNIT/GM EX OINT
TOPICAL_OINTMENT | CUTANEOUS | Status: DC | PRN
  Administered 2012-08-25: 1 via TOPICAL

## 2012-08-25 MED ORDER — PROPOFOL 10 MG/ML IV BOLUS
INTRAVENOUS | Status: DC | PRN
  Administered 2012-08-25: 300 mg via INTRAVENOUS

## 2012-08-25 MED ORDER — HYDROMORPHONE HCL 2 MG PO TABS: 2.0000 mg | ORAL_TABLET | ORAL | Status: DC | PRN

## 2012-08-25 MED ORDER — FENTANYL CITRATE 0.05 MG/ML IJ SOLN
50.0000 ug | INTRAMUSCULAR | Status: DC | PRN
  Administered 2012-08-25 (×2): 100 ug via INTRAVENOUS

## 2012-08-25 MED ORDER — HYDROMORPHONE HCL PF 1 MG/ML IJ SOLN
0.2500 mg | INTRAMUSCULAR | Status: DC | PRN
  Administered 2012-08-25 (×2): 0.5 mg via INTRAVENOUS

## 2012-08-25 MED ORDER — FENTANYL CITRATE 0.05 MG/ML IJ SOLN
INTRAMUSCULAR | Status: DC | PRN
  Administered 2012-08-25 (×4): 25 ug via INTRAVENOUS
  Administered 2012-08-25: 50 ug via INTRAVENOUS

## 2012-08-25 MED ORDER — SODIUM CHLORIDE 0.9 % IV SOLN: INTRAVENOUS | Status: DC

## 2012-08-25 MED ORDER — PROMETHAZINE HCL 25 MG/ML IJ SOLN: 6.2500 mg | INTRAMUSCULAR | Status: DC | PRN

## 2012-08-25 MED ORDER — 0.9 % SODIUM CHLORIDE (POUR BTL) OPTIME
TOPICAL | Status: DC | PRN
  Administered 2012-08-25: 300 mL

## 2012-08-25 MED ORDER — MEPERIDINE HCL 25 MG/ML IJ SOLN: 6.2500 mg | INTRAMUSCULAR | Status: DC | PRN

## 2012-08-25 MED ORDER — ONDANSETRON HCL 4 MG/2ML IJ SOLN
INTRAMUSCULAR | Status: DC | PRN
  Administered 2012-08-25: 4 mg via INTRAVENOUS

## 2012-08-25 MED ORDER — BUPIVACAINE-EPINEPHRINE PF 0.5-1:200000 % IJ SOLN
INTRAMUSCULAR | Status: DC | PRN
  Administered 2012-08-25: 30 mL

## 2012-08-25 MED ORDER — LIDOCAINE HCL (CARDIAC) 20 MG/ML IV SOLN
INTRAVENOUS | Status: DC | PRN
  Administered 2012-08-25: 60 mg via INTRAVENOUS

## 2012-08-25 MED ORDER — MIDAZOLAM HCL 5 MG/5ML IJ SOLN
INTRAMUSCULAR | Status: DC | PRN
  Administered 2012-08-25: 2 mg via INTRAVENOUS

## 2012-08-25 MED ORDER — CHLORHEXIDINE GLUCONATE 4 % EX LIQD: 60.0000 mL | Freq: Once | CUTANEOUS | Status: DC

## 2012-08-25 MED ORDER — OXYCODONE HCL 5 MG/5ML PO SOLN: 5.0000 mg | Freq: Once | ORAL | Status: AC | PRN

## 2012-08-25 MED ORDER — LACTATED RINGERS IV SOLN
INTRAVENOUS | Status: DC
  Administered 2012-08-25 (×3): via INTRAVENOUS

## 2012-08-25 MED ORDER — CEFAZOLIN SODIUM-DEXTROSE 2-3 GM-% IV SOLR
2.0000 g | INTRAVENOUS | Status: AC
  Administered 2012-08-25: 3 g via INTRAVENOUS

## 2012-08-25 MED ORDER — MIDAZOLAM HCL 2 MG/2ML IJ SOLN
1.0000 mg | INTRAMUSCULAR | Status: DC | PRN
  Administered 2012-08-25 (×2): 2 mg via INTRAVENOUS

## 2012-08-25 MED ORDER — DEXAMETHASONE SODIUM PHOSPHATE 10 MG/ML IJ SOLN
INTRAMUSCULAR | Status: DC | PRN
  Administered 2012-08-25: 10 mg via INTRAVENOUS

## 2012-08-25 SURGICAL SUPPLY — 88 items
BAG DECANTER FOR FLEXI CONT (MISCELLANEOUS) IMPLANT
BANDAGE ESMARK 6X9 LF (GAUZE/BANDAGES/DRESSINGS) ×2 IMPLANT
BANDAGE GAUZE ELAST BULKY 4 IN (GAUZE/BANDAGES/DRESSINGS) IMPLANT
BIT DRILL 2.5X2.75 QC CALB (BIT) ×3 IMPLANT
BLADE AVERAGE 25X9 (BLADE) IMPLANT
BLADE MICRO SAGITTAL (BLADE) ×3 IMPLANT
BLADE OSC/SAG .038X5.5 CUT EDG (BLADE) IMPLANT
BLADE SURG 15 STRL LF DISP TIS (BLADE) ×6 IMPLANT
BLADE SURG 15 STRL SS (BLADE) ×6
BNDG CMPR 9X4 STRL LF SNTH (GAUZE/BANDAGES/DRESSINGS)
BNDG CMPR 9X6 STRL LF SNTH (GAUZE/BANDAGES/DRESSINGS) ×1
BNDG COHESIVE 4X5 TAN STRL (GAUZE/BANDAGES/DRESSINGS) ×3 IMPLANT
BNDG COHESIVE 6X5 TAN STRL LF (GAUZE/BANDAGES/DRESSINGS) ×3 IMPLANT
BNDG ESMARK 4X9 LF (GAUZE/BANDAGES/DRESSINGS) IMPLANT
BNDG ESMARK 6X9 LF (GAUZE/BANDAGES/DRESSINGS) ×3
CHLORAPREP W/TINT 26ML (MISCELLANEOUS) ×3 IMPLANT
CLOTH BEACON ORANGE TIMEOUT ST (SAFETY) ×3 IMPLANT
COVER MAYO STAND STRL (DRAPES) ×3 IMPLANT
COVER SURGICAL LIGHT HANDLE (MISCELLANEOUS) ×3 IMPLANT
COVER TABLE BACK 60X90 (DRAPES) ×3 IMPLANT
CUFF TOURNIQUET SINGLE 24IN (TOURNIQUET CUFF) IMPLANT
CUFF TOURNIQUET SINGLE 34IN LL (TOURNIQUET CUFF) ×3 IMPLANT
DECANTER SPIKE VIAL GLASS SM (MISCELLANEOUS) IMPLANT
DRAPE C-ARMOR (DRAPES) ×3 IMPLANT
DRAPE EXTREMITY T 121X128X90 (DRAPE) ×3 IMPLANT
DRAPE INCISE IOBAN 66X45 STRL (DRAPES) IMPLANT
DRAPE SURG 17X23 STRL (DRAPES) IMPLANT
DRAPE U-SHAPE 47X51 STRL (DRAPES) ×3 IMPLANT
DRAPE U-SHAPE 76X120 STRL (DRAPES) IMPLANT
DRSG EMULSION OIL 3X3 NADH (GAUZE/BANDAGES/DRESSINGS) ×9 IMPLANT
DRSG PAD ABDOMINAL 8X10 ST (GAUZE/BANDAGES/DRESSINGS) ×6 IMPLANT
DRSG TEGADERM 2-3/8X2-3/4 SM (GAUZE/BANDAGES/DRESSINGS) IMPLANT
ELECT REM PT RETURN 9FT ADLT (ELECTROSURGICAL) ×3
ELECTRODE REM PT RTRN 9FT ADLT (ELECTROSURGICAL) ×2 IMPLANT
GLOVE BIO SURGEON STRL SZ8 (GLOVE) ×3 IMPLANT
GLOVE BIOGEL M STRL SZ7.5 (GLOVE) ×6 IMPLANT
GLOVE BIOGEL PI IND STRL 8 (GLOVE) ×4 IMPLANT
GLOVE BIOGEL PI INDICATOR 8 (GLOVE) ×2
GLOVE INDICATOR 7.0 STRL GRN (GLOVE) ×3 IMPLANT
GOWN PREVENTION PLUS XLARGE (GOWN DISPOSABLE) IMPLANT
GOWN PREVENTION PLUS XXLARGE (GOWN DISPOSABLE) ×6 IMPLANT
K-WIRE ACE 1.6X6 (WIRE) ×12
KWIRE ACE 1.6X6 (WIRE) ×8 IMPLANT
NDL SAFETY ECLIPSE 18X1.5 (NEEDLE) IMPLANT
NEEDLE HYPO 18GX1.5 SHARP (NEEDLE)
NEEDLE HYPO 22GX1.5 SAFETY (NEEDLE) IMPLANT
PACK BASIN DAY SURGERY FS (CUSTOM PROCEDURE TRAY) ×3 IMPLANT
PAD CAST 4YDX4 CTTN HI CHSV (CAST SUPPLIES) ×4 IMPLANT
PADDING CAST ABS 4INX4YD NS (CAST SUPPLIES)
PADDING CAST ABS COTTON 4X4 ST (CAST SUPPLIES) IMPLANT
PADDING CAST COTTON 4X4 STRL (CAST SUPPLIES) ×6
PADDING CAST COTTON 6X4 STRL (CAST SUPPLIES) ×3 IMPLANT
PENCIL BUTTON HOLSTER BLD 10FT (ELECTRODE) ×3 IMPLANT
PLATE LOCK SM LAT MIDFOOT (Plate) ×3 IMPLANT
PUTTY DBM STAGRAFT 5CC (Putty) ×3 IMPLANT
SCREW ACE CAN 4.0 50M (Screw) ×3 IMPLANT
SCREW ACE CAN 4.0 60M (Screw) ×3 IMPLANT
SCREW CORT 3.5X32 (Screw) ×3 IMPLANT
SCREW CORT T15 30X3.5XST LCK (Screw) ×2 IMPLANT
SCREW CORT T15 32X3.5XST LCK (Screw) ×2 IMPLANT
SCREW CORTICAL 3.5X30MM (Screw) ×3 IMPLANT
SCREW LOCK 3.5X32 DIST TIB (Orthopedic Implant) ×3 IMPLANT
SCREW LOCK CORT STAR 3.5X26 (Screw) ×3 IMPLANT
SCREW LOCK CORT STAR 3.5X34 (Screw) ×3 IMPLANT
SCREW NL LP 36X3.5 (Screw) ×3 IMPLANT
SHEET MEDIUM DRAPE 40X70 STRL (DRAPES) ×3 IMPLANT
SLEEVE SCD COMPRESS KNEE MED (MISCELLANEOUS) ×3 IMPLANT
SPLINT FAST PLASTER 5X30 (CAST SUPPLIES) ×20
SPLINT PLASTER CAST FAST 5X30 (CAST SUPPLIES) ×40 IMPLANT
SPONGE GAUZE 4X4 12PLY (GAUZE/BANDAGES/DRESSINGS) ×3 IMPLANT
SPONGE LAP 18X18 X RAY DECT (DISPOSABLE) ×3 IMPLANT
STAPLER VISISTAT 35W (STAPLE) IMPLANT
STOCKINETTE 6  STRL (DRAPES) ×1
STOCKINETTE 6 STRL (DRAPES) ×2 IMPLANT
STRIP CLOSURE SKIN 1/2X4 (GAUZE/BANDAGES/DRESSINGS) IMPLANT
SUCTION FRAZIER TIP 10 FR DISP (SUCTIONS) ×3 IMPLANT
SUT ETHILON 3 0 PS 1 (SUTURE) ×3 IMPLANT
SUT MNCRL AB 3-0 PS2 18 (SUTURE) ×6 IMPLANT
SUT VIC AB 0 SH 27 (SUTURE) ×3 IMPLANT
SUT VIC AB 2-0 SH 18 (SUTURE) IMPLANT
SUT VIC AB 2-0 SH 27 (SUTURE)
SUT VIC AB 2-0 SH 27XBRD (SUTURE) IMPLANT
SUT VICRYL 4-0 PS2 18IN ABS (SUTURE) IMPLANT
SYR BULB 3OZ (MISCELLANEOUS) ×3 IMPLANT
TOWEL OR 17X24 6PK STRL BLUE (TOWEL DISPOSABLE) ×3 IMPLANT
TUBE CONNECTING 20X1/4 (TUBING) ×3 IMPLANT
UNDERPAD 30X30 INCONTINENT (UNDERPADS AND DIAPERS) ×3 IMPLANT
WATER STERILE IRR 1000ML POUR (IV SOLUTION) IMPLANT

## 2012-08-25 NOTE — H&P (Signed)
Andre Gutierrez is an 42 y.o. male.   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  He also has a tight heelcord.  He presents now for arthrodesis of the talonavicular joint and gastroc recession  Past Medical History  Diagnosis Date  . ETOH abuse   . Anxiety   . Depression   . GERD (gastroesophageal reflux disease)   . Pain     hx pain knees shoulders,feet    Past Surgical History  Procedure Laterality Date  . Eye surgery      stabismus age 71yr  . Wisdom tooth extraction      History reviewed. No pertinent family history. Social History:  reports that he quit smoking about 3 years ago. He uses smokeless tobacco. He reports that  drinks alcohol. He reports that he uses illicit drugs (Amphetamines, Solvent inhalants, and Cocaine).  Allergies:  Allergies  Allergen Reactions  . Acetaminophen Nausea And Vomiting    And diarrhea    Medications Prior to Admission  Medication Sig Dispense Refill  . diphenhydrAMINE (SOMINEX) 25 MG tablet Take 25 mg by mouth at bedtime as needed for sleep.      Marland Kitchen FLUoxetine (PROZAC) 20 MG capsule Take 20 mg by mouth every morning.      Marland Kitchen ibuprofen (ADVIL,MOTRIN) 200 MG tablet Take 200 mg by mouth every 6 (six) hours as needed for pain.        Results for orders placed during the hospital encounter of Aug 30, 2012 (from the past 48 hour(s))  POCT HEMOGLOBIN-HEMACUE     Status: None   Collection Time    30-Aug-2012  7:00 AM      Result Value Range   Hemoglobin 13.9  13.0 - 17.0 g/dL   No results found.  ROS  No recent f/c/n/v/wt loss  Blood pressure 161/100, pulse 88, temperature 98.9 F (37.2 C), temperature source Oral, resp. rate 13, height 6\' 1"  (1.854 m), weight 137.44 kg (303 lb), SpO2 100.00%. Physical Exam obese male in nad.  A and O x 4.  Mood and affect normal.  EOMI.  Resp unlabored.  R foot with healthy skin.  Palpable pulses and intact sens  to LT throughout foot.  Tight heelcord.  5/5 strength in PF and Df of the ankle.  TTP at the TN joint.  Assessment/Plan Right talonavicular arthritis and tight heelcord - to OR for gastroc recession and talonavicular arthrodesis.  The risks and benefits of the alternative treatment options have been discussed in detail.  The patient wishes to proceed with surgery and specifically understands risks of bleeding, infection, nerve damage, blood clots, need for additional surgery, amputation and death.   Toni Arthurs 2012-08-30, 7:22 AM

## 2012-08-25 NOTE — Brief Op Note (Signed)
08/25/2012  10:22 AM  PATIENT:  Andre Gutierrez  42 y.o. male  PRE-OPERATIVE DIAGNOSIS:  1.  Right gastrocnemius contracture      2.  Right talonavicular joint arthritis      3.  Right navicular nonunion  POST-OPERATIVE DIAGNOSIS:  1.  Right gastrocnemius contracture      2.  Right talonavicular joint arthritis      3.  Right navicular nonunion      4.  Right naviculocuneiform joint arthritis  Procedure(s):    1.  Right gastrocnemius recession      2.  Right talonavicular joint arthrodesis      3.  Right naviculocuneiform arthrodesis      SURGEON:  Toni Arthurs, MD  ASSISTANT: n/a  ANESTHESIA:   General, regional  EBL:  minimal   TOURNIQUET:   Total Tourniquet Time Documented: Thigh (Right) - 131 minutes Total: Thigh (Right) - 131 minutes   COMPLICATIONS:  None apparent  DISPOSITION:  Extubated, awake and stable to recovery.  DICTATION ID:  [id number]

## 2012-08-25 NOTE — Anesthesia Postprocedure Evaluation (Signed)
  Anesthesia Post-op Note  Patient: Andre Gutierrez  Procedure(s) Performed: Procedure(s) with comments: Right Talonavicular Arthrodesis,  (Right) - Right Talonavicular Arthrodesis,  Right Gastroc Recession (Right)  Patient Location: PACU  Anesthesia Type:GA combined with regional for post-op pain  Level of Consciousness: awake  Airway and Oxygen Therapy: Patient Spontanous Breathing  Post-op Pain: mild  Post-op Assessment: Post-op Vital signs reviewed  Post-op Vital Signs: stable  Complications: No apparent anesthesia complications

## 2012-08-25 NOTE — Anesthesia Procedure Notes (Addendum)
Anesthesia Regional Block:  Popliteal block  Pre-Anesthetic Checklist: ,, timeout performed, Correct Patient, Correct Site, Correct Laterality, Correct Procedure, Correct Position, site marked, Risks and benefits discussed, pre-op evaluation,  At surgeon's request and post-op pain management  Laterality: Lower and Right  Prep: chloraprep       Needles:  Injection technique: Single-shot  Needle Type: Echogenic Needle      Needle Gauge: 22 and 22 G  Needle insertion depth: 6 cm   Additional Needles:  Procedures: ultrasound guided (picture in chart) and nerve stimulator Popliteal block  Nerve Stimulator or Paresthesia:  Response: Twitch elicited, 0.8 mA, 0.3 ms, 6 cm  Additional Responses:   Narrative:  Start time: 08/25/2012 7:05 AM End time: 08/25/2012 7:18 AM Injection made incrementally with aspirations every 5 mL.  Performed by: Personally  Anesthesiologist: Oren Section, MD  Additional Notes: This is a lateral approach to the popliteal fossa area. A stimulator needle is used starting at 1.5 mAmp current and descending as nerve contact is made. Injection of anesthetic is in 5 ml increments with multiple neg aspirations before continuing. Total volume is 40 cc.    Popliteal block Procedure Name: LMA Insertion Date/Time: 08/25/2012 7:38 AM Performed by: Jadalee Westcott D Pre-anesthesia Checklist: Patient identified, Emergency Drugs available, Suction available and Patient being monitored Patient Re-evaluated:Patient Re-evaluated prior to inductionOxygen Delivery Method: Circle System Utilized Preoxygenation: Pre-oxygenation with 100% oxygen Intubation Type: IV induction Ventilation: Mask ventilation without difficulty LMA: LMA inserted LMA Size: 5.0 Number of attempts: 1 Placement Confirmation: positive ETCO2 Tube secured with: Tape Dental Injury: Teeth and Oropharynx as per pre-operative assessment

## 2012-08-25 NOTE — Anesthesia Preprocedure Evaluation (Addendum)
Anesthesia Evaluation  Patient identified by MRN, date of birth, ID band Patient awake    Reviewed: Allergy & Precautions, NPO status   Airway Mallampati: II  Neck ROM: Full    Dental  (+) Teeth Intact   Pulmonary  breath sounds clear to auscultation        Cardiovascular Rhythm:Regular Rate:Normal     Neuro/Psych PSYCHIATRIC DISORDERS Depression    GI/Hepatic GERD-  Controlled,  Endo/Other  Morbid obesity  Renal/GU      Musculoskeletal   Abdominal (+) + obese,   Peds  Hematology   Anesthesia Other Findings   Reproductive/Obstetrics                         Anesthesia Physical Anesthesia Plan  ASA: II  Anesthesia Plan: General   Post-op Pain Management:    Induction: Intravenous  Airway Management Planned: LMA  Additional Equipment:   Intra-op Plan:   Post-operative Plan: Extubation in OR  Informed Consent: I have reviewed the patients History and Physical, chart, labs and discussed the procedure including the risks, benefits and alternatives for the proposed anesthesia with the patient or authorized representative who has indicated his/her understanding and acceptance.   Dental advisory given  Plan Discussed with: CRNA and Surgeon  Anesthesia Plan Comments:         Anesthesia Quick Evaluation

## 2012-08-25 NOTE — Progress Notes (Signed)
Assisted Dr. Massagee with right, ultrasound guided, popliteal/saphenous block. Side rails up, monitors on throughout procedure. See vital signs in flow sheet. Tolerated Procedure well. 

## 2012-08-25 NOTE — Transfer of Care (Signed)
Immediate Anesthesia Transfer of Care Note  Patient: Andre Gutierrez  Procedure(s) Performed: Procedure(s) with comments: Right Talonavicular Arthrodesis,  (Right) - Right Talonavicular Arthrodesis,  Right Gastroc Recession (Right)  Patient Location: PACU  Anesthesia Type:GA combined with regional for post-op pain  Level of Consciousness: awake and patient cooperative  Airway & Oxygen Therapy: Patient Spontanous Breathing and Patient connected to face mask oxygen  Post-op Assessment: Report given to PACU RN and Post -op Vital signs reviewed and stable  Post vital signs: Reviewed and stable  Complications: No apparent anesthesia complications

## 2012-08-26 ENCOUNTER — Encounter (HOSPITAL_BASED_OUTPATIENT_CLINIC_OR_DEPARTMENT_OTHER): Payer: Self-pay | Admitting: Orthopedic Surgery

## 2012-08-26 NOTE — Op Note (Signed)
NAME:  Andre Gutierrez, Andre Gutierrez NO.:  [id number]  MEDICAL RECORD NO.:  [id number]  LOCATION:                                 FACILITY:  PHYSICIAN:  Toni Arthurs, MD             DATE OF BIRTH:  DATE OF PROCEDURE:  08/25/2012 DATE OF DISCHARGE:                              OPERATIVE REPORT   POSTOPERATIVE DIAGNOSES: 1. Right foot gastrocnemius contracture. 2. 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  POSTOPERATIVE DIAGNOSES: 1. Right foot gastrocnemius contracture. 2. 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  PROCEDURE: 1. Right gastrocnemius recession. 2. Right talonavicular joint arthrodesis. 3. Right naviculocuneiform joint arthrodesis.  SURGEON:  Toni Arthurs, MD.  ANESTHESIA:  General, regional.  ESTIMATED BLOOD LOSS:  Minimal.  TOURNIQUET TIME:  2 hours and 11 minute at 300 mmHg.  COMPLICATIONS:  None.  DISPOSITION:  Extubated, awake and stable to recovery.  INDICATIONS FOR PROCEDURE:  The patient is a 42 year old male who injured his foot over a year ago.  He describes stumbling and jamming his foot against a curb.  He developed severe pain, but sought no treatment as he had no health insurance at that time.  He presented to my office approximately 2 months ago complaining of continued pain and swelling at the right foot.  X-rays of the right foot revealed displaced navicular fracture that had gone on to a nonunion.  The patient also had evidence of collapse across the talar head with posttraumatic arthritis at the talonavicular joint.  He presents now for the talonavicular joint arthrodesis and a gastrocnemius recession to treat his contracted gastrocnemius tendon.  He understands the risks and benefits, the alternative treatment options, elects surgical treatment.  He specifically understands risks of bleeding, infection, nerve damage, blood clots, need for  additional surgery, amputation, and death.  PROCEDURE IN DETAIL:  After preoperative consent was obtained, the correct operative site was identified.  The patient was brought to the operating room and placed supine on the operating table.  General anesthesia was induced.  Preoperative antibiotics were administered. Surgical time-out was taken.  The right lower extremity was prepped and draped in a standard sterile fashion.  The extremity was exsanguinated and thigh tourniquet was inflated to 300 mmHg.  A longitudinal incision was made over the medial calf.  A sharp dissection was carried down through the skin subcutaneous tissue.  The plantaris tendon was identified.  It was transected the gastrocnemius tendon was identified. Care was taken to protect the sural nerve while the tendon was transected from medial to lateral and its entirety.  After releasing the gastrocnemius tendon.  The ankle would dorsiflex to 30 degrees with the knee extended.  The wound was irrigated.  Monocryl sutures were used to close the subcutaneous tissue and running nylon sutures were used to close the skin incision.  Attention was then turned to the dorsum of the foot were longitudinal incision was made over the talonavicular and navicular cuneiform joints. Sharp dissection was carried down through the skin and subcutaneous tissue.  The interval between the tibialis anterior and extensor hallucis longus tendon was then identified.  The dorsal aspect of the talonavicular joint was identified.  The subperiosteal dissection was then carried medially and laterally exposing the talonavicular joint. The dorsum of the navicular and the navicular cuneiform joints.  The navicular was noted to be in 3 different fragments with the lateral and central fragments severely degenerated.  Essentially these were nonviable.  They were removed and all of the bone was crushed up for bone graft.  The medial navicular was cleaned of  all remaining cartilage on both the proximal and distal ends.  It was left attached to the soft tissue sleeve medially.  The proximal ends of the cuneiforms were cleaned of all cartilage.  The head of the talus was noted to be crushed medially.  The subchondral bone was resected laterally to make the talus flush.  The wound was irrigated copiously, a 2.5-mm drill bit was then used to make multiple perforations through the articular surfaces of the talar head.  The navicular proximally and distally and the cuneiforms proximally.  The navicular cuneiform and talonavicular joints were reduced.  They were pinned provisionally with pins across the talonavicular joint as well as across the navicular cuneiform and talonavicular joints.  AP and lateral fluoroscopic images confirmed appropriate reduction of the medial column of the foot with appropriate alignment.  Two 4-mm partially-threaded cannulated screws were inserted across the talonavicular and navicular cuneiform joints.  Three medial stab incisions.  These were noted to have excellent purchase and compressed both of the joints appropriately.  Laterally at the location of the crushed navicular.  There was a void of bone.  This was packed with the bone graft as well as some demineralized bone matrix and cancellous allograft chips.  A compression plate was then selected from the [employer] foot plates.  This was applied to the dorsum of the talus and spanned the bone void and went to the middle and lateral cuneiforms. This was provisionally pinned into place.  AP and lateral x-rays confirmed appropriate positioning of the plate.  It was fixed proximally with two nonlocking screws and a locking screw.  Distally two compression screws were inserted and were tightened compressing the joint appropriately.  Two more locking screws were inserted.  Final AP and lateral views showed appropriate position and length of all hardware and appropriate  reduction of the talonavicular and navicular cuneiform joints.  Wound was irrigated copiously.  The deep soft tissues were approximated over the plate with simple sutures of #0 Vicryl.  The extensor retinaculum was closed with simple sutures of #0 Vicryl. Subcutaneous tissue was approximated with inverted simple sutures of 3-0 Monocryl and a running 3-0 nylon was used to close the skin incision. Sterile dressings were applied followed by a well-padded short-leg splint.  Tourniquet was released at 2 hours and 11 minute.  The patient was then awakened from anesthesia and transported to the recovery room in stable condition.  FOLLOWUP PLAN:  The patient will be discharged home today.  He will be nonweightbearing on his right lower extremity.  We will see him back in the office in 2 weeks.  We will call for home health to out assess physical therapy and occupational therapy needs.     Toni Arthurs, MD     JH/MEDQ  D:  08/25/2012  T:  08/26/2012  Job:  [id number]

## 2013-03-09 ENCOUNTER — Other Ambulatory Visit: Payer: Self-pay | Admitting: Orthopedic Surgery

## 2013-03-09 DIAGNOSIS — M79671 Pain in right foot: Secondary | ICD-10-CM

## 2013-07-23 DIAGNOSIS — T8484XA Pain due to internal orthopedic prosthetic devices, implants and grafts, initial encounter: Secondary | ICD-10-CM

## 2013-07-23 DIAGNOSIS — IMO0002 Reserved for concepts with insufficient information to code with codable children: Secondary | ICD-10-CM

## 2013-07-23 HISTORY — DX: Pain due to internal orthopedic prosthetic devices, implants and grafts, initial encounter: T84.84XA

## 2013-07-23 HISTORY — DX: Reserved for concepts with insufficient information to code with codable children: IMO0002

## 2013-08-01 ENCOUNTER — Encounter (HOSPITAL_BASED_OUTPATIENT_CLINIC_OR_DEPARTMENT_OTHER): Payer: Self-pay | Admitting: *Deleted

## 2013-08-01 NOTE — Progress Notes (Signed)
08/01/13 1511  OBSTRUCTIVE SLEEP APNEA  Have you ever been diagnosed with sleep apnea through a sleep study? No  Do you snore loudly (loud enough to be heard through closed doors)?  1  Do you often feel tired, fatigued, or sleepy during the daytime? 0  Has anyone observed you stop breathing during your sleep? 1  Do you have, or are you being treated for high blood pressure? 0  BMI more than 35 kg/m2? 1  Age over 42 years old? 0  Gender: 1  Obstructive Sleep Apnea Score 4  Score 4 or greater  Results sent to PCP (Dr. Leilani AbleBetti Reese)

## 2013-08-02 ENCOUNTER — Other Ambulatory Visit: Payer: Self-pay | Admitting: Orthopedic Surgery

## 2013-08-02 NOTE — H&P (Signed)
Andre BraverWilliam B Gutierrez is an 43 y.o. male.   Chief Complaint: right ankle pain HPI: Pt has a nonunion of his right talonavicular joint.  Pt reports to OR for revision of talonavicular and subtalar arthrodesis with hardware removal and possible autograft from distal tibia or calcaneus.    Past Medical History  Diagnosis Date  . Depression   . GERD (gastroesophageal reflux disease)     occasional - no current med.  . Mood swings   . Dental crowns present   . Chronic lower back pain   . Fracture, nonunion 07/2013    right foot talonavicular nonunion  . Painful orthopaedic hardware 07/2013    right foot    Past Surgical History  Procedure Laterality Date  . Eye muscle surgery      x 2 - as a child  . Wisdom tooth extraction    . Foot arthrodesis Right 08/25/2012    Procedure: Right Talonavicular Arthrodesis, ;  Surgeon: Toni ArthursJohn Lorianne Malbrough, MD;  Location: [facility];  Service: Orthopedics;  Laterality: Right;  Right Talonavicular Arthrodesis,  . Gastroc recession extremity Right 08/25/2012    Procedure:  Right Gastroc Recession;  Surgeon: Toni ArthursJohn Chevon Fomby, MD;  Location: [facility];  Service: Orthopedics;  Laterality: Right;    History reviewed. No pertinent family history. Social History:  reports that he quit smoking about 3 years ago. He has never used smokeless tobacco. He reports that he drinks alcohol. He reports that he does not use illicit drugs.  Allergies: No Known Allergies  No prescriptions prior to admission    No results found for this or any previous visit (from the past 48 hour(s)). No results found.  Review of Systems  Constitutional: Negative.   HENT: Negative.   Eyes: Negative.   Respiratory: Negative.   Gastrointestinal: Negative.   Musculoskeletal: Negative.   Skin: Negative.   Neurological: Negative for tremors.  Endo/Heme/Allergies: Negative.   Psychiatric/Behavioral: The patient does not have insomnia.     Height 6\' 1"  (1.854 m), weight  131.543 kg (290 lb). Physical Exam  WD WN 43 y/o male in NAD, A/Ox3, appears stated age.  EOMI, mood and affect normal, respirations unlabored. Antalgic gait to the right. Positive TTP to talonavicular joint. Skin healthy and intact.  Normal sensation to light touch.  Dorsalis pedis pulses 2+ bilaterally.  Strength 5/5 DF PF inversion, eversion of the ankle joint.  Distal toes well perfused with cap refill < 2 sec. Assessment/Plan Pt reports to OR for revision of talonavicular and subtalar arthrodesis with removal of hardware and possible autograft from distal tibia or calcaneus of right lower extremity.  FLOWERS, CHRISTOPHER S 08/02/2013, 3:58 PM  Agree with note above.  To OR for removal of hardware, revision talonavicular and subtalar arthrodesis with possible calcaneal or distal tibia autograft.  The risks and benefits of the alternative treatment options have been discussed in detail.  The patient wishes to proceed with surgery and specifically understands risks of bleeding, infection, nerve damage, blood clots, need for additional surgery, amputation and death.

## 2013-08-03 ENCOUNTER — Encounter (HOSPITAL_BASED_OUTPATIENT_CLINIC_OR_DEPARTMENT_OTHER): Payer: 59 | Admitting: Certified Registered"

## 2013-08-03 ENCOUNTER — Ambulatory Visit (HOSPITAL_BASED_OUTPATIENT_CLINIC_OR_DEPARTMENT_OTHER): Payer: 59 | Admitting: Certified Registered"

## 2013-08-03 ENCOUNTER — Encounter (HOSPITAL_BASED_OUTPATIENT_CLINIC_OR_DEPARTMENT_OTHER)
Admission: RE | Disposition: A | Discharge status: Home / Self Care | Payer: Self-pay | Source: Ambulatory Visit | Attending: Orthopedic Surgery

## 2013-08-03 ENCOUNTER — Ambulatory Visit (HOSPITAL_COMMUNITY): Payer: 59

## 2013-08-03 ENCOUNTER — Encounter (HOSPITAL_BASED_OUTPATIENT_CLINIC_OR_DEPARTMENT_OTHER): Payer: Self-pay | Admitting: *Deleted

## 2013-08-03 ENCOUNTER — Ambulatory Visit (HOSPITAL_BASED_OUTPATIENT_CLINIC_OR_DEPARTMENT_OTHER)
Admission: RE | Admit: 2013-08-03 | Discharge: 2013-08-04 | Disposition: A | Discharge status: Home / Self Care | Payer: 59 | Source: Ambulatory Visit | Attending: Orthopedic Surgery | Admitting: Orthopedic Surgery

## 2013-08-03 DIAGNOSIS — G8929 Other chronic pain: Secondary | ICD-10-CM | POA: Insufficient documentation

## 2013-08-03 DIAGNOSIS — F3289 Other specified depressive episodes: Secondary | ICD-10-CM | POA: Insufficient documentation

## 2013-08-03 DIAGNOSIS — Z6835 Body mass index (BMI) 35.0-35.9, adult: Secondary | ICD-10-CM | POA: Insufficient documentation

## 2013-08-03 DIAGNOSIS — IMO0002 Reserved for concepts with insufficient information to code with codable children: Secondary | ICD-10-CM | POA: Diagnosis present

## 2013-08-03 DIAGNOSIS — E11610 Type 2 diabetes mellitus with diabetic neuropathic arthropathy: Secondary | ICD-10-CM

## 2013-08-03 DIAGNOSIS — K219 Gastro-esophageal reflux disease without esophagitis: Secondary | ICD-10-CM | POA: Insufficient documentation

## 2013-08-03 DIAGNOSIS — M545 Low back pain, unspecified: Secondary | ICD-10-CM | POA: Insufficient documentation

## 2013-08-03 DIAGNOSIS — Z87891 Personal history of nicotine dependence: Secondary | ICD-10-CM | POA: Insufficient documentation

## 2013-08-03 DIAGNOSIS — T84498A Other mechanical complication of other internal orthopedic devices, implants and grafts, initial encounter: Secondary | ICD-10-CM | POA: Insufficient documentation

## 2013-08-03 DIAGNOSIS — F329 Major depressive disorder, single episode, unspecified: Secondary | ICD-10-CM | POA: Insufficient documentation

## 2013-08-03 DIAGNOSIS — T8489XA Other specified complication of internal orthopedic prosthetic devices, implants and grafts, initial encounter: Secondary | ICD-10-CM | POA: Insufficient documentation

## 2013-08-03 DIAGNOSIS — Y831 Surgical operation with implant of artificial internal device as the cause of abnormal reaction of the patient, or of later complication, without mention of misadventure at the time of the procedure: Secondary | ICD-10-CM | POA: Insufficient documentation

## 2013-08-03 DIAGNOSIS — A5211 Tabes dorsalis: Secondary | ICD-10-CM | POA: Insufficient documentation

## 2013-08-03 HISTORY — DX: Low back pain: M54.5

## 2013-08-03 HISTORY — DX: Reserved for concepts with insufficient information to code with codable children: IMO0002

## 2013-08-03 HISTORY — PX: FOOT ARTHRODESIS: SHX1655

## 2013-08-03 HISTORY — DX: Dental restoration status: Z98.811

## 2013-08-03 HISTORY — DX: Pain due to internal orthopedic prosthetic devices, implants and grafts, initial encounter: T84.84XA

## 2013-08-03 HISTORY — PX: HARDWARE REMOVAL: SHX979

## 2013-08-03 HISTORY — DX: Low back pain, unspecified: M54.50

## 2013-08-03 HISTORY — DX: Emotional lability: R45.86

## 2013-08-03 HISTORY — DX: Other chronic pain: G89.29

## 2013-08-03 LAB — POCT HEMOGLOBIN-HEMACUE: Hemoglobin: 14.4 g/dL (ref 13.0–17.0)

## 2013-08-03 SURGERY — REMOVAL, HARDWARE
Anesthesia: Regional | Site: Foot | Laterality: Right

## 2013-08-03 MED ORDER — SENNA 8.6 MG PO TABS: 1.0000 | ORAL_TABLET | Freq: Two times a day (BID) | ORAL | Status: DC

## 2013-08-03 MED ORDER — BACITRACIN ZINC 500 UNIT/GM EX OINT
TOPICAL_OINTMENT | CUTANEOUS | Status: DC | PRN
  Administered 2013-08-03: 1 via TOPICAL

## 2013-08-03 MED ORDER — FENTANYL CITRATE 0.05 MG/ML IJ SOLN
INTRAMUSCULAR | Status: DC | PRN
  Administered 2013-08-03: 75 ug via INTRAVENOUS
  Administered 2013-08-03 (×2): 50 ug via INTRAVENOUS
  Administered 2013-08-03: 25 ug via INTRAVENOUS
  Administered 2013-08-03 (×2): 50 ug via INTRAVENOUS

## 2013-08-03 MED ORDER — MIDAZOLAM HCL 2 MG/2ML IJ SOLN: 4.0000 mg | INTRAMUSCULAR | Status: DC | PRN

## 2013-08-03 MED ORDER — FLUOXETINE HCL 20 MG PO CAPS: 20.0000 mg | ORAL_CAPSULE | Freq: Every morning | ORAL | Status: DC

## 2013-08-03 MED ORDER — LIDOCAINE HCL (CARDIAC) 20 MG/ML IV SOLN
INTRAVENOUS | Status: DC | PRN
  Administered 2013-08-03: 30 mg via INTRAVENOUS

## 2013-08-03 MED ORDER — MIDAZOLAM HCL 2 MG/2ML IJ SOLN
INTRAMUSCULAR | Status: AC
  Filled 2013-08-03: qty 2

## 2013-08-03 MED ORDER — CEFAZOLIN SODIUM 1-5 GM-% IV SOLN
INTRAVENOUS | Status: AC
  Filled 2013-08-03: qty 50

## 2013-08-03 MED ORDER — CEFAZOLIN SODIUM-DEXTROSE 2-3 GM-% IV SOLR
INTRAVENOUS | Status: AC
  Filled 2013-08-03: qty 50

## 2013-08-03 MED ORDER — MIDAZOLAM HCL 2 MG/2ML IJ SOLN
1.0000 mg | INTRAMUSCULAR | Status: DC | PRN
  Administered 2013-08-03: 4 mg via INTRAVENOUS

## 2013-08-03 MED ORDER — HYDROMORPHONE HCL PF 1 MG/ML IJ SOLN
INTRAMUSCULAR | Status: AC
  Filled 2013-08-03: qty 1

## 2013-08-03 MED ORDER — HYDROMORPHONE HCL 2 MG PO TABS: 2.0000 mg | ORAL_TABLET | ORAL | Status: DC | PRN

## 2013-08-03 MED ORDER — DOCUSATE SODIUM 100 MG PO CAPS: 100.0000 mg | ORAL_CAPSULE | Freq: Two times a day (BID) | ORAL | Status: DC

## 2013-08-03 MED ORDER — FENTANYL CITRATE 0.05 MG/ML IJ SOLN
INTRAMUSCULAR | Status: AC
  Filled 2013-08-03: qty 2

## 2013-08-03 MED ORDER — MIDAZOLAM HCL 2 MG/ML PO SYRP: 12.0000 mg | ORAL_SOLUTION | Freq: Once | ORAL | Status: DC | PRN

## 2013-08-03 MED ORDER — BUPIVACAINE-EPINEPHRINE PF 0.5-1:200000 % IJ SOLN
INTRAMUSCULAR | Status: DC | PRN
  Administered 2013-08-03: 30 mL via PERINEURAL

## 2013-08-03 MED ORDER — 0.9 % SODIUM CHLORIDE (POUR BTL) OPTIME
TOPICAL | Status: DC | PRN
  Administered 2013-08-03: 1000 mL

## 2013-08-03 MED ORDER — OXYCODONE HCL 5 MG PO TABS: 5.0000 mg | ORAL_TABLET | ORAL | Status: DC | PRN

## 2013-08-03 MED ORDER — BUPIVACAINE HCL (PF) 0.5 % IJ SOLN
INTRAMUSCULAR | Status: DC | PRN
  Administered 2013-08-03: 20 mL

## 2013-08-03 MED ORDER — SODIUM CHLORIDE 0.9 % IV SOLN: INTRAVENOUS | Status: DC

## 2013-08-03 MED ORDER — ONDANSETRON HCL 4 MG PO TABS: 4.0000 mg | ORAL_TABLET | Freq: Four times a day (QID) | ORAL | Status: DC | PRN

## 2013-08-03 MED ORDER — OXYCODONE HCL 5 MG PO TABS: 5.0000 mg | ORAL_TABLET | Freq: Once | ORAL | Status: DC | PRN

## 2013-08-03 MED ORDER — FENTANYL CITRATE 0.05 MG/ML IJ SOLN
INTRAMUSCULAR | Status: AC
  Filled 2013-08-03: qty 6

## 2013-08-03 MED ORDER — MIDAZOLAM HCL 5 MG/5ML IJ SOLN
INTRAMUSCULAR | Status: DC | PRN
  Administered 2013-08-03: 2 mg via INTRAVENOUS

## 2013-08-03 MED ORDER — DEXTROSE 5 % IV SOLN
3.0000 g | INTRAVENOUS | Status: AC
  Administered 2013-08-03: 1 g via INTRAVENOUS
  Administered 2013-08-03: 3 g via INTRAVENOUS

## 2013-08-03 MED ORDER — SODIUM CHLORIDE 0.9 % IV SOLN
INTRAVENOUS | Status: DC
  Administered 2013-08-03 (×2): via INTRAVENOUS
  Administered 2013-08-03: 100 mL/h via INTRAVENOUS

## 2013-08-03 MED ORDER — ACETAMINOPHEN 500 MG PO TABS: 1000.0000 mg | ORAL_TABLET | Freq: Once | ORAL | Status: DC

## 2013-08-03 MED ORDER — FENTANYL CITRATE 0.05 MG/ML IJ SOLN
50.0000 ug | INTRAMUSCULAR | Status: DC | PRN
  Administered 2013-08-03: 200 ug via INTRAVENOUS

## 2013-08-03 MED ORDER — OXYCODONE HCL 5 MG/5ML PO SOLN: 5.0000 mg | Freq: Once | ORAL | Status: DC | PRN

## 2013-08-03 MED ORDER — FENTANYL CITRATE 0.05 MG/ML IJ SOLN: 200.0000 ug | INTRAMUSCULAR | Status: DC | PRN

## 2013-08-03 MED ORDER — HYDROMORPHONE HCL PF 1 MG/ML IJ SOLN
0.2500 mg | INTRAMUSCULAR | Status: DC | PRN
  Administered 2013-08-03 (×3): 0.5 mg via INTRAVENOUS

## 2013-08-03 MED ORDER — OXYCODONE HCL 5 MG PO TABS
5.0000 mg | ORAL_TABLET | ORAL | Status: DC | PRN
  Administered 2013-08-03 (×3): 10 mg via ORAL
  Administered 2013-08-03: 5 mg via ORAL
  Administered 2013-08-04 (×3): 10 mg via ORAL
  Filled 2013-08-03 (×7): qty 2

## 2013-08-03 MED ORDER — PROPOFOL 10 MG/ML IV BOLUS
INTRAVENOUS | Status: DC | PRN
  Administered 2013-08-03: 300 mg via INTRAVENOUS

## 2013-08-03 MED ORDER — CHLORHEXIDINE GLUCONATE 4 % EX LIQD: 60.0000 mL | Freq: Once | CUTANEOUS | Status: DC

## 2013-08-03 MED ORDER — INSULIN ASPART 100 UNIT/ML ~~LOC~~ SOLN: 0.0000 [IU] | Freq: Three times a day (TID) | SUBCUTANEOUS | Status: DC

## 2013-08-03 MED ORDER — ONDANSETRON HCL 4 MG/2ML IJ SOLN: 4.0000 mg | Freq: Four times a day (QID) | INTRAMUSCULAR | Status: DC | PRN

## 2013-08-03 MED ORDER — LACTATED RINGERS IV SOLN
INTRAVENOUS | Status: DC
  Administered 2013-08-03 (×3): via INTRAVENOUS

## 2013-08-03 MED ORDER — HYDROMORPHONE HCL PF 1 MG/ML IJ SOLN
0.5000 mg | INTRAMUSCULAR | Status: DC | PRN
  Administered 2013-08-03 – 2013-08-04 (×9): 1 mg via INTRAVENOUS
  Filled 2013-08-03 (×10): qty 1

## 2013-08-03 MED ORDER — DEXAMETHASONE SODIUM PHOSPHATE 10 MG/ML IJ SOLN
INTRAMUSCULAR | Status: DC | PRN
  Administered 2013-08-03: 10 mg via INTRAVENOUS

## 2013-08-03 MED ORDER — BUPIVACAINE HCL (PF) 0.25 % IJ SOLN
INTRAMUSCULAR | Status: AC
  Filled 2013-08-03: qty 30

## 2013-08-03 SURGICAL SUPPLY — 103 items
2.8 KWIRE ×3 IMPLANT
4.8 CANNULATED DRILL ×3 IMPLANT
BAG DECANTER FOR FLEXI CONT (MISCELLANEOUS) IMPLANT
BANDAGE ELASTIC 4 VELCRO ST LF (GAUZE/BANDAGES/DRESSINGS) IMPLANT
BANDAGE ESMARK 6X9 LF (GAUZE/BANDAGES/DRESSINGS) ×1 IMPLANT
BIT DRILL 2.5X2.75 QC CALB (BIT) ×3 IMPLANT
BIT DRILL CALIBRATED 2.7 (BIT) ×2 IMPLANT
BIT DRILL CALIBRATED 2.7MM (BIT) ×1
BLADE AVERAGE 25MMX9MM (BLADE)
BLADE AVERAGE 25X9 (BLADE) IMPLANT
BLADE MICRO SAGITTAL (BLADE) IMPLANT
BLADE OSC/SAG .038X5.5 CUT EDG (BLADE) IMPLANT
BLADE SURG 15 STRL LF DISP TIS (BLADE) ×3 IMPLANT
BLADE SURG 15 STRL SS (BLADE) ×9
BNDG CMPR 9X4 STRL LF SNTH (GAUZE/BANDAGES/DRESSINGS)
BNDG CMPR 9X6 STRL LF SNTH (GAUZE/BANDAGES/DRESSINGS) ×1
BNDG COHESIVE 4X5 TAN STRL (GAUZE/BANDAGES/DRESSINGS) ×3 IMPLANT
BNDG COHESIVE 6X5 TAN STRL LF (GAUZE/BANDAGES/DRESSINGS) ×3 IMPLANT
BNDG ESMARK 4X9 LF (GAUZE/BANDAGES/DRESSINGS) IMPLANT
BNDG ESMARK 6X9 LF (GAUZE/BANDAGES/DRESSINGS) ×3
BONE CHIP PRESERV 5CC PCAN5 (Bone Implant) ×6 IMPLANT
BRUSH SCRUB EZ PLAIN DRY (MISCELLANEOUS) ×3 IMPLANT
CHLORAPREP W/TINT 26ML (MISCELLANEOUS) ×3 IMPLANT
CLOSURE WOUND 1/2 X4 (GAUZE/BANDAGES/DRESSINGS)
COVER TABLE BACK 60X90 (DRAPES) ×3 IMPLANT
CUFF TOURNIQUET SINGLE 34IN LL (TOURNIQUET CUFF) ×3 IMPLANT
DECANTER SPIKE VIAL GLASS SM (MISCELLANEOUS) IMPLANT
DRAPE C-ARM 42X72 X-RAY (DRAPES) ×3 IMPLANT
DRAPE C-ARMOR (DRAPES) ×3 IMPLANT
DRAPE EXTREMITY T 121X128X90 (DRAPE) ×3 IMPLANT
DRAPE OEC MINIVIEW 54X84 (DRAPES) IMPLANT
DRAPE SURG 17X23 STRL (DRAPES) IMPLANT
DRAPE U-SHAPE 47X51 STRL (DRAPES) ×3 IMPLANT
DRSG EMULSION OIL 3X3 NADH (GAUZE/BANDAGES/DRESSINGS) ×3 IMPLANT
DRSG TEGADERM 4X4.75 (GAUZE/BANDAGES/DRESSINGS) IMPLANT
ELECT REM PT RETURN 9FT ADLT (ELECTROSURGICAL) ×3
ELECTRODE REM PT RTRN 9FT ADLT (ELECTROSURGICAL) ×1 IMPLANT
GLOVE BIO SURGEON STRL SZ8 (GLOVE) ×3 IMPLANT
GLOVE BIOGEL M 7.0 STRL (GLOVE) ×3 IMPLANT
GLOVE BIOGEL PI IND STRL 7.5 (GLOVE) IMPLANT
GLOVE BIOGEL PI IND STRL 8 (GLOVE) ×1 IMPLANT
GLOVE BIOGEL PI INDICATOR 7.5 (GLOVE)
GLOVE BIOGEL PI INDICATOR 8 (GLOVE) ×2
GLOVE ECLIPSE 6.5 STRL STRAW (GLOVE) ×3 IMPLANT
GLOVE ECLIPSE 7.0 STRL STRAW (GLOVE) IMPLANT
GLOVE EXAM NITRILE MD LF STRL (GLOVE) ×3 IMPLANT
GOWN STRL REUS W/ TWL LRG LVL3 (GOWN DISPOSABLE) ×1 IMPLANT
GOWN STRL REUS W/ TWL XL LVL3 (GOWN DISPOSABLE) ×1 IMPLANT
GOWN STRL REUS W/TWL LRG LVL3 (GOWN DISPOSABLE) ×3
GOWN STRL REUS W/TWL XL LVL3 (GOWN DISPOSABLE) ×3
KIT INFUSE X SMALL 1.4CC (Orthopedic Implant) ×3 IMPLANT
NDL SAFETY ECLIPSE 18X1.5 (NEEDLE) IMPLANT
NEEDLE HYPO 18GX1.5 SHARP (NEEDLE)
NEEDLE HYPO 22GX1.5 SAFETY (NEEDLE) IMPLANT
PACK BASIN DAY SURGERY FS (CUSTOM PROCEDURE TRAY) ×3 IMPLANT
PAD ABD 8X10 STRL (GAUZE/BANDAGES/DRESSINGS) ×6 IMPLANT
PAD CAST 4YDX4 CTTN HI CHSV (CAST SUPPLIES) ×2 IMPLANT
PADDING CAST ABS 4INX4YD NS (CAST SUPPLIES)
PADDING CAST ABS COTTON 4X4 ST (CAST SUPPLIES) IMPLANT
PADDING CAST COTTON 4X4 STRL (CAST SUPPLIES) ×6
PADDING CAST COTTON 6X4 STRL (CAST SUPPLIES) ×3 IMPLANT
PENCIL BUTTON HOLSTER BLD 10FT (ELECTRODE) ×3 IMPLANT
PLATE LOCK COMP LG (Plate) ×6 IMPLANT
SANITIZER HAND PURELL 535ML FO (MISCELLANEOUS) IMPLANT
SCREW 6.5X55MM FT (Screw) ×3 IMPLANT
SCREW 8.0X80MMX16 (Screw) ×3 IMPLANT
SCREW CANN 5.0X55 (Screw) ×3 IMPLANT
SCREW LOCK CORT STAR 3.5X20 (Screw) ×3 IMPLANT
SCREW LOCK CORT STAR 3.5X22 (Screw) ×3 IMPLANT
SCREW LOCK CORT STAR 3.5X24 (Screw) ×6 IMPLANT
SCREW LOCK CORT STAR 3.5X36 (Screw) ×3 IMPLANT
SCREW LOW PROFILE 22MMX3.5MM (Screw) ×3 IMPLANT
SCREW LP 3.5X44 (Screw) ×3 IMPLANT
SCREW NON LOCKING LP 3.5 14MM (Screw) ×6 IMPLANT
SHEET MEDIUM DRAPE 40X70 STRL (DRAPES) ×9 IMPLANT
SLEEVE SCD COMPRESS KNEE MED (MISCELLANEOUS) ×3 IMPLANT
SPLINT FAST PLASTER 5X30 (CAST SUPPLIES) ×40
SPLINT PLASTER CAST FAST 5X30 (CAST SUPPLIES) ×20 IMPLANT
SPONGE GAUZE 4X4 12PLY (GAUZE/BANDAGES/DRESSINGS) ×3 IMPLANT
SPONGE LAP 18X18 X RAY DECT (DISPOSABLE) ×3 IMPLANT
STAPLER VISISTAT 35W (STAPLE) IMPLANT
STOCKINETTE 6  STRL (DRAPES) ×2
STOCKINETTE 6 STRL (DRAPES) ×1 IMPLANT
STRIP CLOSURE SKIN 1/2X4 (GAUZE/BANDAGES/DRESSINGS) IMPLANT
SUCTION FRAZIER TIP 10 FR DISP (SUCTIONS) ×3 IMPLANT
SUT ETHIBOND 2 OS 4 DA (SUTURE) IMPLANT
SUT ETHILON 3 0 PS 1 (SUTURE) ×9 IMPLANT
SUT MNCRL AB 3-0 PS2 18 (SUTURE) ×9 IMPLANT
SUT MNCRL AB 4-0 PS2 18 (SUTURE) IMPLANT
SUT VIC AB 0 CT1 27 (SUTURE)
SUT VIC AB 0 CT1 27XBRD ANBCTR (SUTURE) IMPLANT
SUT VIC AB 0 SH 27 (SUTURE) IMPLANT
SUT VIC AB 2-0 SH 18 (SUTURE) IMPLANT
SUT VIC AB 2-0 SH 27 (SUTURE) ×3
SUT VIC AB 2-0 SH 27XBRD (SUTURE) ×1 IMPLANT
SUT VICRYL 4-0 PS2 18IN ABS (SUTURE) IMPLANT
SYR BULB 3OZ (MISCELLANEOUS) ×3 IMPLANT
SYR CONTROL 10ML LL (SYRINGE) IMPLANT
TOWEL OR 17X24 6PK STRL BLUE (TOWEL DISPOSABLE) ×3 IMPLANT
TOWEL OR NON WOVEN STRL DISP B (DISPOSABLE) ×3 IMPLANT
TUBE CONNECTING 20'X1/4 (TUBING) ×1
TUBE CONNECTING 20X1/4 (TUBING) ×2 IMPLANT
UNDERPAD 30X30 INCONTINENT (UNDERPADS AND DIAPERS) ×3 IMPLANT

## 2013-08-03 NOTE — Discharge Instructions (Signed)
Toni ArthursJohn Hewitt, MD [facility]  Please read the following information regarding your care after surgery.  Medications  You only need a prescription for the narcotic pain medicine (ex. oxycodone, Percocet, Norco).  All of the other medicines listed below are available over the counter. X acetominophen (Tylenol) 650 mg every 4-6 hours as you need for minor pain X dilaudid as prescribed for moderate to severe pain   Narcotic pain medicine (ex. oxycodone, Percocet, Vicodin) will cause constipation.  To prevent this problem, take the following medicines while you are taking any pain medicine. X docusate sodium (Colace) 100 mg twice a day X senna (Senokot) 2 tablets twice a day  X To help prevent blood clots, take an aspirin (325 mg) once a day for a month after surgery.  You should also get up every hour while you are awake to move around.    Weight Bearing X Do not bear any weight on the operated leg or foot.  Cast / Splint / Dressing X Keep your splint or cast clean and dry.  Dont put anything (coat hanger, pencil, etc) down inside of it.  If it gets damp, use a hair dryer on the cool setting to dry it.  If it gets soaked, call the office to schedule an appointment for a cast change.  Swelling It is normal for you to have swelling where you had surgery.  To reduce swelling and pain, keep your toes above your nose for at least 3 days after surgery.  It may be necessary to keep your foot or leg elevated for several weeks.  If it hurts, it should be elevated.  Follow Up Call my office at [phone] when you are discharged from the hospital or surgery center to schedule an appointment to be seen two weeks after surgery.  Call my office at [phone] if you develop a fever >101.5 F, nausea, vomiting, bleeding from the surgical site or severe pain.

## 2013-08-03 NOTE — Transfer of Care (Signed)
Immediate Anesthesia Transfer of Care Note  Patient: Andre Gutierrez  Procedure(s) Performed: Procedure(s): HARDWARE REMOVAL (Right) RIGHT FOOT REMOVAL OF HARDWARE, AUTOGRAFT BONE TO TALONAVICULAR JOINT, REVISION OF TN AND SUBTALAR ARTHRODESIS  (Right)  Patient Location: PACU  Anesthesia Type:GA combined with regional for post-op pain  Level of Consciousness: awake, alert , oriented and patient cooperative  Airway & Oxygen Therapy: Patient Spontanous Breathing and Patient connected to face mask oxygen  Post-op Assessment: Report given to PACU RN and Post -op Vital signs reviewed and stable  Post vital signs: Reviewed and stable  Complications: No apparent anesthesia complications

## 2013-08-03 NOTE — Anesthesia Postprocedure Evaluation (Signed)
  Anesthesia Post-op Note  Patient: Garald BraverWilliam B Boline  Procedure(s) Performed: Procedure(s): HARDWARE REMOVAL (Right) RIGHT FOOT REMOVAL OF HARDWARE, AUTOGRAFT BONE TO TALONAVICULAR JOINT, REVISION OF TN AND SUBTALAR ARTHRODESIS  (Right)  Patient Location: PACU  Anesthesia Type:General and Popliteal block  Level of Consciousness: awake and alert   Airway and Oxygen Therapy: Patient Spontanous Breathing  Post-op Pain: mild  Post-op Assessment: Post-op Vital signs reviewed, Patient's Cardiovascular Status Stable and Respiratory Function Stable  Post-op Vital Signs: Reviewed  Filed Vitals:   08/03/13 1300  BP: 131/58  Pulse: 92  Temp:   Resp: 19    Complications: No apparent anesthesia complications

## 2013-08-03 NOTE — Anesthesia Procedure Notes (Addendum)
Anesthesia Regional Block:  Popliteal block  Pre-Anesthetic Checklist: ,, timeout performed, Correct Patient, Correct Site, Correct Laterality, Correct Procedure, Correct Position, site marked, Risks and benefits discussed, pre-op evaluation, post-op pain management  Laterality: Right  Prep: Maximum Sterile Barrier Precautions used and chloraprep       Needles:  Injection technique: Single-shot  Needle Type: Echogenic Stimulator Needle      Needle Gauge: 21 and 21 G    Additional Needles:  Procedures: ultrasound guided (picture in chart) and nerve stimulator Popliteal block  Nerve Stimulator or Paresthesia:  Response: Peroneal,  Response: Tibial,   Additional Responses:   Narrative:  Start time: 08/03/2013 8:36 AM End time: 08/03/2013 8:52 AM Injection made incrementally with aspirations every 5 mL. Anesthesiologist: Sampson GoonFitzgerald, MD  Additional Notes: 2% Lidocaine skin wheel. Saphenous block with 15cc of 0.5% Bupivicaine plain.   Procedure Name: LMA Insertion Date/Time: 08/03/2013 9:08 AM Performed by: Lorren Rossetti Pre-anesthesia Checklist: Patient identified, Emergency Drugs available, Suction available and Patient being monitored Patient Re-evaluated:Patient Re-evaluated prior to inductionOxygen Delivery Method: Circle System Utilized Preoxygenation: Pre-oxygenation with 100% oxygen Intubation Type: IV induction Ventilation: Mask ventilation without difficulty LMA: LMA inserted LMA Size: 5.0 Number of attempts: 1 Airway Equipment and Method: bite block Placement Confirmation: positive ETCO2 Tube secured with: Tape Dental Injury: Teeth and Oropharynx as per pre-operative assessment

## 2013-08-03 NOTE — Anesthesia Preprocedure Evaluation (Addendum)
Anesthesia Evaluation  Patient identified by MRN, date of birth, ID band Patient awake    Reviewed: Allergy & Precautions, H&P , NPO status , Patient's Chart, lab work & pertinent test results  Airway Mallampati: III TM Distance: >3 FB Neck ROM: Full    Dental no notable dental hx. (+) Teeth Intact, Dental Advisory Given   Pulmonary neg pulmonary ROS, former smoker,  breath sounds clear to auscultation  Pulmonary exam normal       Cardiovascular negative cardio ROS  Rhythm:Regular Rate:Normal     Neuro/Psych PSYCHIATRIC DISORDERS Depression negative neurological ROS  negative psych ROS   GI/Hepatic Neg liver ROS, GERD-  Controlled,  Endo/Other  Morbid obesity  Renal/GU negative Renal ROS  negative genitourinary   Musculoskeletal   Abdominal   Peds  Hematology negative hematology ROS (+)   Anesthesia Other Findings   Reproductive/Obstetrics negative OB ROS                          Anesthesia Physical Anesthesia Plan  ASA: II  Anesthesia Plan: General and Regional   Post-op Pain Management:    Induction: Intravenous  Airway Management Planned: LMA  Additional Equipment:   Intra-op Plan:   Post-operative Plan: Extubation in OR  Informed Consent: I have reviewed the patients History and Physical, chart, labs and discussed the procedure including the risks, benefits and alternatives for the proposed anesthesia with the patient or authorized representative who has indicated his/her understanding and acceptance.   Dental advisory given  Plan Discussed with: CRNA  Anesthesia Plan Comments:         Anesthesia Quick Evaluation

## 2013-08-03 NOTE — Brief Op Note (Signed)
08/03/2013  12:13 PM  PATIENT:  Andre Gutierrez  43 y.o. male  PRE-OPERATIVE DIAGNOSIS:  Right foot talonavicular non union and painful hardware right medial cuneiform, talus and navicular  POST-OPERATIVE DIAGNOSIS:  same  Procedure(s): 1.  Removal of deep implant right dorsal talus 2.  Removal of deep implant right medial cuneiform (separate incision) 3.  Removal of deep implant right navicular (separate incision) 4.  Revision arthrodesis of the right talonavicular joint 5.  Arthrodesis of the right subtalar joint 6.  3 view radiograph of the right ankle  SURGEON:  Toni ArthursJohn Moana Munford, MD  ASSISTANT: Lorin PicketScott Flowers, PA-C  ANESTHESIA:   General, regional  EBL:  minimal   TOURNIQUET:   Total Tourniquet Time Documented: Thigh (Right) - 143 minutes Total: Thigh (Right) - 143 minutes   COMPLICATIONS:  None apparent  DISPOSITION:  Extubated, awake and stable to recovery.  DICTATION ID:   [id number]

## 2013-08-04 NOTE — Op Note (Signed)
NAME:  Andre Gutierrez, Andre Gutierrez NO.:  [id number]  MEDICAL RECORD NO.:  [id number]  LOCATION:                                 FACILITY:  PHYSICIAN:  Toni Arthurs, MD             DATE OF BIRTH:  DATE OF PROCEDURE:  08/03/2013 DATE OF DISCHARGE:                              OPERATIVE REPORT   PREOPERATIVE DIAGNOSES: 1. Painful hardware, right dorsal talus. 2. Painful hardware, right medial cuneiform. 3. Painful hardware, right navicular. 4. Nonunion of the right talonavicular joint, status post arthrodesis     for Charcot neuroarthropathy.  POSTOPERATIVE DIAGNOSES: 1. Painful hardware, right dorsal talus. 2. Painful hardware, right medial cuneiform. 3. Painful hardware, right navicular. 4. Nonunion of the right talonavicular joint, status post arthrodesis     for Charcot neuroarthropathy.  PROCEDURE: 1. Removal of deep implant from the right dorsal talus. 2. Removal of deep implant from the right medial cuneiform through a     separate incision. 3. Removal of deep implant from the right navicular through a separate     incision. 4. Revision arthrodesis of the right talonavicular joint. 5. Arthrodesis of the right subtalar joint. 6. Three view radiographs of the right ankle.  SURGEON:  Toni Arthurs, MD  ASSISTANT:  Lorin Picket Flowers, PA-C.  ANESTHESIA:  General, regional.  ESTIMATED BLOOD LOSS:  Minimal.  TOURNIQUET TIME:  2 hours and 23 minutes at 250 mmHg.  COMPLICATIONS:  None apparent.  DISPOSITION:  Extubated, awake and stable to recovery.  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  He underwent arthrodesis of the right talonavicular joint to treat this condition last year.  He went on to nonunion of that joint and had hardware failure across the talonavicular and navicular cuneiform joints.  He presents now for revision  arthrodesis of the talonavicular joint as well as subtalar joint arthrodesis.  He will also require removal of all of the broken hardware.  He understands the risks and benefits, the alternative treatment options and elects surgical treatment.  He specifically understands risks of bleeding, infection, nerve damage, blood clots, need for additional surgery, failure to heal, amputation, and death.  PROCEDURE IN DETAIL:  After preoperative consent was obtained and the correct operative site was identified, the patient was brought to the operating room and placed supine on the operating table.  General anesthesia was induced.  Preoperative antibiotics were administered. Surgical time-out was taken.  The right lower extremity was prepped and draped in standard sterile fashion with a tourniquet around the thigh. The extremity was exsanguinated and the tourniquet was inflated to 250 mmHg.  The patient's previous dorsal incision was made.  Sharp dissection was carried down through the skin and subcutaneous tissue. The extensor hallucis longus and tibialis anterior tendons were identified and protected throughout the case.  The dorsal plate was identified.  Dissection was carried around the plate and cleared of all soft tissues.  All of the screws were removed, followed by the plate. The dorsal talonavicular joint was then exposed and cleared of all its fibrous  tissue.  There was considerable lysis of the talar head with essentially absence of the lateral third.  The medial third was significantly degenerated as well.  The screws across the talonavicular joint were identified.  The broken segment of 1 of the screws was removed from the talar head.  A stab incision was made at the medial cuneiform.  Sharp dissection was carried down through the skin and subcutaneous tissue to the level of the head.  The head was exposed and cleared of all soft tissues.  The remaining portion of the screw  was removed.  The more proximal screw was identified.  Again, sharp dissection was carried down through the skin and subcutaneous tissue. The head was cleared of all soft tissues.  The screw was grasped from within the talonavicular joint, and pushed out through the navicular medially.  The screw head was grasped and the screw was removed.  The wound was irrigated copiously.  Attention was then turned to the lateral aspect of the hindfoot, where a sinus tarsi incision was made.  Sharp dissection was carried down through the skin and subcutaneous tissue.  The peroneal tendons were protected as the subtalar joint was exposed.  All articular cartilage was removed from the joint.  The wound was irrigated copiously.  The 2.5- mm drill bit was used to perforate both sides of the joint in multiple locations leaving the resultant bone graft in place.  The joint was reduced.  A stab incision was made at the heel.  A guide pin for the [employer] 8 mm partially threaded cannulated screws was then inserted from the posterior aspect of the calcaneus across the subtalar joint into the lateral half of the talar dome.  AP ankle and lateral ankle films showed appropriate reduction of the talonavicular joint and appropriate position of the guide pin.  The guide pin was over drilled and an 8 mm partially threaded cannulated screw was inserted.  It was noted to have excellent compression across the subtalar joint.  The heel wound was then closed with horizontal mattress suture of 3-0 nylon.  Attention was then returned to the dorsal aspect of the talus.  A guide pin was inserted down through the dorsal talar neck into the calcaneus. The guidepin was measured and overdrilled.  A fully-threaded 6.5-mm cannulated screw was then inserted over the guide pin was noted to have excellent purchase.  The talonavicular joint was then prepared by perforating the head of the talus and the navicular with a 2.5-mm drill  bit in multiple locations. Autograft bone was packed in the joint, followed by a cancellous chips and a piece of infuse.  The wound was further packed with cancellous chips.  A closed compression plate was selected from the Biomet ALPS foot plate set.  This was applied medially and fixed distally and proximally with 1 locking and 1 nonlocking screw.  Dorsally another closed plate was applied across the talonavicular joint.  Two locking screws were applied distally and 1 locking and 1 nonlocking screw proximally.  Prior to placing the plate, a 5 mm partially threaded cannulated screw was inserted across the talonavicular joint from distal to proximal.  This was noted to compress the joint appropriately.  The 2 plates were placed in a static fashion to hold the compression achieved by the compression screw.  The incisions were all closed with 2-0 Vicryl at the level of the deep subcutaneous tissue, 3-0 Monocryl at the subcutaneous tissue superficially, and a running 3-0 nylon at the  skin incisions.  Sterile dressings were applied, followed by a well-padded short-leg splint.  The tourniquet had been released at 2 hours and 23 minutes.  Hemostasis was achieved prior to closure of  the wounds.  The patient was then awakened from anesthesia and transported to the recovery room in stable condition.  FOLLOWUP PLAN:  The patient will be observed overnight for pain control and sleep apnea.  He will be nonweightbearing on the right lower extremity and follow up with me in 2 weeks for suture removal and conversion to a cast.  Scott Flowers, PA-C was present and scrubbed for the duration of the case.  Assistance was essentially gaining and maintaining exposure, performing the critical portions of the operation closing and dressing the wounds, and applying the splint.     Toni Arthurs, MD     JH/MEDQ  D:  08/03/2013  T:  08/04/2013  Job:  [id number]

## 2013-08-07 ENCOUNTER — Encounter (HOSPITAL_BASED_OUTPATIENT_CLINIC_OR_DEPARTMENT_OTHER): Payer: Self-pay | Admitting: Orthopedic Surgery

## 2014-06-22 DIAGNOSIS — T40601A Poisoning by unspecified narcotics, accidental (unintentional), initial encounter: Secondary | ICD-10-CM

## 2014-06-22 HISTORY — DX: Poisoning by unspecified narcotics, accidental (unintentional), initial encounter: T40.601A

## 2014-10-10 ENCOUNTER — Other Ambulatory Visit: Payer: Self-pay | Admitting: Neurosurgery

## 2014-10-10 DIAGNOSIS — M542 Cervicalgia: Secondary | ICD-10-CM

## 2014-10-10 DIAGNOSIS — M545 Low back pain: Secondary | ICD-10-CM

## 2014-10-28 ENCOUNTER — Other Ambulatory Visit: Payer: 59

## 2014-11-15 ENCOUNTER — Ambulatory Visit
Admission: RE | Admit: 2014-11-15 | Discharge: 2014-11-15 | Disposition: A | Discharge status: Home / Self Care | Payer: BLUE CROSS/BLUE SHIELD | Source: Ambulatory Visit | Attending: Neurosurgery | Admitting: Neurosurgery

## 2014-11-15 DIAGNOSIS — M542 Cervicalgia: Secondary | ICD-10-CM

## 2014-11-15 DIAGNOSIS — M545 Low back pain: Secondary | ICD-10-CM

## 2014-12-16 ENCOUNTER — Encounter (HOSPITAL_COMMUNITY): Payer: Self-pay | Admitting: Emergency Medicine

## 2014-12-16 ENCOUNTER — Emergency Department (HOSPITAL_COMMUNITY)
Admission: EM | Admit: 2014-12-16 | Discharge: 2014-12-16 | Disposition: A | Discharge status: Home / Self Care | Payer: BLUE CROSS/BLUE SHIELD | Attending: Emergency Medicine | Admitting: Emergency Medicine

## 2014-12-16 DIAGNOSIS — Y998 Other external cause status: Secondary | ICD-10-CM | POA: Diagnosis not present

## 2014-12-16 DIAGNOSIS — G8929 Other chronic pain: Secondary | ICD-10-CM | POA: Insufficient documentation

## 2014-12-16 DIAGNOSIS — Z79899 Other long term (current) drug therapy: Secondary | ICD-10-CM | POA: Insufficient documentation

## 2014-12-16 DIAGNOSIS — X58XXXA Exposure to other specified factors, initial encounter: Secondary | ICD-10-CM | POA: Insufficient documentation

## 2014-12-16 DIAGNOSIS — T40604A Poisoning by unspecified narcotics, undetermined, initial encounter: Secondary | ICD-10-CM

## 2014-12-16 DIAGNOSIS — Y9289 Other specified places as the place of occurrence of the external cause: Secondary | ICD-10-CM | POA: Diagnosis not present

## 2014-12-16 DIAGNOSIS — T507X1A Poisoning by analeptics and opioid receptor antagonists, accidental (unintentional), initial encounter: Secondary | ICD-10-CM | POA: Diagnosis present

## 2014-12-16 DIAGNOSIS — F329 Major depressive disorder, single episode, unspecified: Secondary | ICD-10-CM | POA: Diagnosis not present

## 2014-12-16 DIAGNOSIS — Z87891 Personal history of nicotine dependence: Secondary | ICD-10-CM | POA: Diagnosis not present

## 2014-12-16 DIAGNOSIS — R000 Tachycardia, unspecified: Secondary | ICD-10-CM | POA: Insufficient documentation

## 2014-12-16 DIAGNOSIS — Y9389 Activity, other specified: Secondary | ICD-10-CM | POA: Insufficient documentation

## 2014-12-16 DIAGNOSIS — Z8781 Personal history of (healed) traumatic fracture: Secondary | ICD-10-CM | POA: Insufficient documentation

## 2014-12-16 DIAGNOSIS — F1010 Alcohol abuse, uncomplicated: Secondary | ICD-10-CM

## 2014-12-16 DIAGNOSIS — Z8719 Personal history of other diseases of the digestive system: Secondary | ICD-10-CM | POA: Diagnosis not present

## 2014-12-16 LAB — CBC WITH DIFFERENTIAL/PLATELET
BASOS PCT: 0 % (ref 0–1)
Basophils Absolute: 0 10*3/uL (ref 0.0–0.1)
EOS ABS: 0.1 10*3/uL (ref 0.0–0.7)
EOS PCT: 0 % (ref 0–5)
HEMATOCRIT: 35.3 % — AB (ref 39.0–52.0)
Hemoglobin: 11.7 g/dL — ABNORMAL LOW (ref 13.0–17.0)
LYMPHS ABS: 0.9 10*3/uL (ref 0.7–4.0)
LYMPHS PCT: 7 % — AB (ref 12–46)
MCH: 33 pg (ref 26.0–34.0)
MCHC: 33.1 g/dL (ref 30.0–36.0)
MCV: 99.4 fL (ref 78.0–100.0)
MONO ABS: 0.6 10*3/uL (ref 0.1–1.0)
Monocytes Relative: 4 % (ref 3–12)
NEUTROS PCT: 89 % — AB (ref 43–77)
Neutro Abs: 10.9 10*3/uL — ABNORMAL HIGH (ref 1.7–7.7)
PLATELETS: 218 10*3/uL (ref 150–400)
RBC: 3.55 MIL/uL — AB (ref 4.22–5.81)
RDW: 13.2 % (ref 11.5–15.5)
WBC: 12.4 10*3/uL — ABNORMAL HIGH (ref 4.0–10.5)

## 2014-12-16 LAB — RAPID URINE DRUG SCREEN, HOSP PERFORMED
AMPHETAMINES: NOT DETECTED
Barbiturates: NOT DETECTED
Benzodiazepines: NOT DETECTED
COCAINE: NOT DETECTED
Opiates: POSITIVE — AB
TETRAHYDROCANNABINOL: NOT DETECTED

## 2014-12-16 LAB — BASIC METABOLIC PANEL
Anion gap: 16 — ABNORMAL HIGH (ref 5–15)
BUN: 8 mg/dL (ref 6–20)
CO2: 19 mmol/L — AB (ref 22–32)
CREATININE: 0.96 mg/dL (ref 0.61–1.24)
Calcium: 8.2 mg/dL — ABNORMAL LOW (ref 8.9–10.3)
Chloride: 101 mmol/L (ref 101–111)
GFR calc non Af Amer: >60 mL/min (ref >60)
Glucose, Bld: 173 mg/dL — ABNORMAL HIGH (ref 65–99)
POTASSIUM: 3 mmol/L — AB (ref 3.5–5.1)
Sodium: 136 mmol/L (ref 135–145)

## 2014-12-16 LAB — ETHANOL: Alcohol, Ethyl (B): 174 mg/dL — ABNORMAL HIGH (ref <5)

## 2014-12-16 LAB — ACETAMINOPHEN LEVEL: Acetaminophen (Tylenol), Serum: <10 ug/mL — ABNORMAL LOW (ref 10–30)

## 2014-12-16 LAB — MAGNESIUM: MAGNESIUM: 2.1 mg/dL (ref 1.7–2.4)

## 2014-12-16 LAB — SALICYLATE LEVEL: Salicylate Lvl: <4 mg/dL (ref 2.8–30.0)

## 2014-12-16 MED ORDER — POTASSIUM CHLORIDE CRYS ER 20 MEQ PO TBCR
40.0000 meq | EXTENDED_RELEASE_TABLET | Freq: Once | ORAL | Status: AC
  Administered 2014-12-16: 40 meq via ORAL
  Filled 2014-12-16: qty 2

## 2014-12-16 MED ORDER — SODIUM CHLORIDE 0.9 % IV BOLUS (SEPSIS)
1000.0000 mL | Freq: Once | INTRAVENOUS | Status: AC
  Administered 2014-12-16: 1000 mL via INTRAVENOUS

## 2014-12-16 MED ORDER — POTASSIUM CHLORIDE 10 MEQ/100ML IV SOLN
10.0000 meq | INTRAVENOUS | Status: AC
Start: 2014-12-16 — End: 2014-12-16
  Administered 2014-12-16 (×2): 10 meq via INTRAVENOUS
  Filled 2014-12-16 (×2): qty 100

## 2014-12-16 NOTE — ED Notes (Signed)
Oxygen turned off at this time.

## 2014-12-16 NOTE — Discharge Instructions (Signed)
Alcohol Intoxication Andre Gutierrez, you were seen today for drug overdose.  You need to seek help to stop drinking alcohol and using prescription drugs.  See a primary care physician within 3 days for help.  If symptoms worsen, come back to the ED immediately.  Thank you. Alcohol intoxication occurs when you drink enough alcohol that it affects your ability to function. It can be mild or very severe. Drinking a lot of alcohol in a short time is called binge drinking. This can be very harmful. Drinking alcohol can also be more dangerous if you are taking medicines or other drugs. Some of the effects caused by alcohol may include:  Loss of coordination.  Changes in mood and behavior.  Unclear thinking.  Trouble talking (slurred speech).  Throwing up (vomiting).  Confusion.  Slowed breathing.  Twitching and shaking (seizures).  Loss of consciousness. HOME CARE  Do not drive after drinking alcohol.  Drink enough water and fluids to keep your pee (urine) clear or pale yellow. Avoid caffeine.  Only take medicine as told by your doctor. GET HELP IF:  You throw up (vomit) many times.  You do not feel better after a few days.  You frequently have alcohol intoxication. Your doctor can help decide if you should see a substance use treatment counselor. GET HELP RIGHT AWAY IF:  You become shaky when you stop drinking.  You have twitching and shaking.  You throw up blood. It may look bright red or like coffee grounds.  You notice blood in your poop (bowel movements).  You become lightheaded or pass out (faint). MAKE SURE YOU:   Understand these instructions.  Will watch your condition.  Will get help right away if you are not doing well or get worse. Document Released: 11/25/2007 Document Revised: 02/08/2013 Document Reviewed: 11/11/2012 [facility] Patient Information 2015 [place], [place]. This information is not intended to replace advice given to you by your health care  provider. Make sure you discuss any questions you have with your health care provider. Narcotic Overdose A narcotic overdose is the misuse or overuse of a narcotic drug. A narcotic overdose can make you pass out and stop breathing. If you are not treated right away, this can cause permanent brain damage or stop your heart. Medicine may be given to reverse the effects of an overdose. If so, this medicine may bring on withdrawal symptoms. The symptoms may be abdominal cramps, throwing up (vomiting), sweating, chills, and nervousness. Injecting narcotics can cause more problems than just an overdose. AIDS, hepatitis, and other very serious infections are transmitted by sharing needles and syringes. If you decide to quit using, there are medicines which can help you through the withdrawal period. Trying to quit all at once on your own can be uncomfortable, but not life-threatening. Call your caregiver, Narcotics Anonymous, or any drug and alcohol treatment program for further help.  Document Released: 07/16/2004 Document Revised: 08/31/2011 Document Reviewed: 05/10/2009 [facility] Patient Information 2015 [place], [place]. This information is not intended to replace advice given to you by your health care provider. Make sure you discuss any questions you have with your health care provider.

## 2014-12-16 NOTE — ED Notes (Signed)
Patient took 8 percocet and 10-12 Roxicet with a 5th of vodka.  Patient was found to be unresponsive with snoring respirations on the couch by wife.  EMS gave 2mg  Narcan IN and 2mg  IV.  Patient is awake at this time, CAOx4, sitting up and talking.

## 2014-12-16 NOTE — ED Notes (Signed)
Weaning oxygen down, 2L via [place].

## 2014-12-16 NOTE — ED Notes (Signed)
Patient placed on 5L via [place] for low oxygen sats.  MD notified.  No new orders.

## 2014-12-16 NOTE — ED Provider Notes (Signed)
CSN: [id number]     Arrival date & time 12/16/14  0012 History  This chart was scribed for Tomasita Crumble, MD by Freida Busman, ED Scribe. This patient was seen in room [facility] and the patient's care was started 12:18 AM.    Chief Complaint  Patient presents with  . Drug Overdose   The history is provided by the patient. No language interpreter was used.     HPI Comments:  Andre Gutierrez is a 44 y.o. male brought in by ambulance, who presents to the Emergency Department for drug overdose. Pt admits to taking 8 percocet and 10-12 roxicet tablets as well as consuming a 5th of Vodka this evening. He was found passed out on the couch by his wife who called EMS because the pt was not breathing. He was given 4 mg Narcan en route with improvement. 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 He denies SI. He also denies pain and injury.    Past Medical History  Diagnosis Date  . Depression   . GERD (gastroesophageal reflux disease)     occasional - no current med.  . Mood swings   . Dental crowns present   . Chronic lower back pain   . Fracture, nonunion 07/2013    right foot talonavicular nonunion  . Painful orthopaedic hardware 07/2013    right foot   Past Surgical History  Procedure Laterality Date  . Eye muscle surgery      x 2 - as a child  . Wisdom tooth extraction    . Foot arthrodesis Right 08/25/2012    Procedure: Right Talonavicular Arthrodesis, ;  Surgeon: Toni Arthurs, MD;  Location: [facility];  Service: Orthopedics;  Laterality: Right;  Right Talonavicular Arthrodesis,  . Gastroc recession extremity Right 08/25/2012    Procedure:  Right Gastroc Recession;  Surgeon: Toni Arthurs, MD;  Location: [facility];  Service: Orthopedics;  Laterality: Right;  . Hardware removal Right 08/03/2013    Procedure: HARDWARE REMOVAL;  Surgeon: Toni Arthurs, MD;  Location: [facility];  Service: Orthopedics;  Laterality: Right;  . Foot arthrodesis Right 08/03/2013    Procedure: RIGHT FOOT REMOVAL OF HARDWARE, AUTOGRAFT BONE TO TALONAVICULAR JOINT, REVISION OF TALONAVICULAR AND SUBTALAR ARTHRODESIS ;  Surgeon: Toni Arthurs, MD;  Location: [facility];  Service: Orthopedics;  Laterality: Right;   History reviewed. No pertinent family history. History  Substance Use Topics  . Smoking status: Former Smoker    Quit date: 08/23/2009  . Smokeless tobacco: Never Used  . Alcohol Use: Yes     Comment: 2 x/week    Review of Systems  A complete 10 system review of systems was obtained and all systems are negative except as noted in the HPI and PMH.    Allergies  Review of patient's allergies indicates no known allergies.  Home Medications   Prior to Admission medications   Medication Sig Start Date End Date Taking? Authorizing Provider  FLUoxetine (PROZAC) 20 MG capsule Take 20 mg by mouth every morning.    Historical Provider, MD  oxyCODONE (ROXICODONE) 5 MG immediate release tablet Take 1-2 tablets (5-10 mg total) by mouth every 4 (four) hours as needed. 08/03/13   Di Kindle Flowers, PA-C  oxyCODONE-acetaminophen (PERCOCET) 10-325 MG per tablet Take 1 tablet by mouth every 4 (four) hours as needed for pain.    Historical Provider, MD  BP 107/64 mmHg  Pulse 71  Temp(Src) 99.6 F (37.6 C) (Oral)  Resp 14  SpO2 87% Physical Exam  Constitutional: He is oriented to person, place, and time. Vital signs are normal. He appears well-developed and well-nourished.  Non-toxic appearance. He does not appear ill. No distress.  Clinically intoxicated    HENT:  Head: Normocephalic and atraumatic.  Nose: Nose normal.  Mouth/Throat: Oropharynx is clear and moist. No oropharyngeal exudate.  Eyes: Conjunctivae and EOM are normal. Pupils are equal, round, and reactive to light. No scleral icterus.  Neck: Normal range of motion. Neck supple. No tracheal  deviation, no edema, no erythema and normal range of motion present. No thyroid mass and no thyromegaly present.  Cardiovascular: Regular rhythm, S1 normal, S2 normal, normal heart sounds, intact distal pulses and normal pulses.  Tachycardia present.  Exam reveals no gallop and no friction rub.   No murmur heard. Pulses:      Radial pulses are 2+ on the right side, and 2+ on the left side.       Dorsalis pedis pulses are 2+ on the right side, and 2+ on the left side.  Pulmonary/Chest: Effort normal and breath sounds normal. No respiratory distress. He has no wheezes. He has no rhonchi. He has no rales.  Nasal Cannula  in place   Abdominal: Soft. Normal appearance and bowel sounds are normal. He exhibits no distension, no ascites and no mass. There is no hepatosplenomegaly. There is no tenderness. There is no rebound, no guarding and no CVA tenderness.  Musculoskeletal: Normal range of motion. He exhibits no edema or tenderness.  Lymphadenopathy:    He has no cervical adenopathy.  Neurological: He is alert and oriented to person, place, and time. He has normal strength. No cranial nerve deficit or sensory deficit.  Nml strength and sensation all extremities  Skin: Skin is warm, dry and intact. No petechiae and no rash noted. He is not diaphoretic. No erythema. No pallor.  Psychiatric: He has a normal mood and affect. His behavior is normal. Judgment normal.  Nursing note and vitals reviewed.   ED Course  Procedures   DIAGNOSTIC STUDIES:  Oxygen Saturation is 93% on 4L [place], low by my interpretation.    COORDINATION OF CARE:  12:22 AM Discussed treatment plan with pt at bedside and pt agreed to plan.  Labs Review Labs Reviewed  ETHANOL - Abnormal; Notable for the following:    Alcohol, Ethyl (B) 174 (*)    All other components within normal limits  URINE RAPID DRUG SCREEN, HOSP PERFORMED - Abnormal; Notable for the following:    Opiates POSITIVE (*)    All other components within  normal limits  CBC WITH DIFFERENTIAL/PLATELET - Abnormal; Notable for the following:    WBC 12.4 (*)    RBC 3.55 (*)    Hemoglobin 11.7 (*)    HCT 35.3 (*)    Neutrophils Relative % 89 (*)    Neutro Abs 10.9 (*)    Lymphocytes Relative 7 (*)    All other components within normal limits  BASIC METABOLIC PANEL - Abnormal; Notable for the following:    Potassium 3.0 (*)    CO2 19 (*)    Glucose, Bld 173 (*)    Calcium 8.2 (*)    Anion gap 16 (*)    All other components within normal limits  ACETAMINOPHEN LEVEL - Abnormal; Notable for the following:    Acetaminophen (Tylenol), Serum <10 (*)    All other  components within normal limits  MAGNESIUM  SALICYLATE LEVEL    Imaging Review No results found.   EKG Interpretation   Date/Time:  Sunday December 16 2014 00:17:17 EDT Ventricular Rate:  122 PR Interval:  97 QRS Duration: 113 QT Interval:  388 QTC Calculation: 553 R Axis:   -75 Text Interpretation:  Sinus tachycardia Borderline IVCD with LAD Inferior  infarct, old Prolonged QT interval Confirmed by Erroll Luna  [phone]) on 12/16/2014 12:23:23 AM      MDM   Final diagnoses:  None   Patient since emergency department after an overdose. This was intentional however the patient was not trying to commit suicide. He has been addicted to painkillers and took too many as well as alcohol tonight. He denies falling, he was found on his counts. I see no signs of external trauma. I do not believe CT scan the head is warranted. Patient will be observed in the emergency department until vital signs come back to normal. His oxygen is currently 85-92 on 5 L nasal cannula. He is awake alert and oriented. He was given 1 L of IV fluids.  Patient has been observed in the emergency department overnight. His blood pressure did fall as low as 87 systolic, this is likely due to overdose on narcotics. His oxygen was slowly titrated down to room air. He is currently on room air with oxygen  saturation 92%. He is medically sober. Patient is advised to stop abusing narcotics and alcohol. He demonstrated understanding. His vital signs were within his normal limits and is safe for discharge.  Patient was ambulated and had a walking oxygen saturation of 97%. He was also given  Oral and IV potassium replacement.   I personally performed the services described in this documentation, which was scribed in my presence. The recorded information has been reviewed and is accurate.    Tomasita Crumble, MD 12/16/14 [phone]

## 2015-05-22 HISTORY — PX: NECK SURGERY: SHX720

## 2015-08-30 ENCOUNTER — Encounter: Payer: Self-pay | Admitting: Internal Medicine

## 2015-10-22 ENCOUNTER — Ambulatory Visit: Payer: BLUE CROSS/BLUE SHIELD | Admitting: Internal Medicine

## 2015-10-22 ENCOUNTER — Telehealth: Payer: Self-pay | Admitting: Internal Medicine

## 2015-10-22 NOTE — Telephone Encounter (Signed)
No charge. 

## 2015-11-13 ENCOUNTER — Ambulatory Visit (INDEPENDENT_AMBULATORY_CARE_PROVIDER_SITE_OTHER): Payer: BLUE CROSS/BLUE SHIELD | Admitting: Adult Health

## 2015-11-13 ENCOUNTER — Encounter: Payer: Self-pay | Admitting: Adult Health

## 2015-11-13 VITALS — BP 150/90 | Temp 98.1°F | Ht 73.0 in | Wt 287.7 lb

## 2015-11-13 DIAGNOSIS — R030 Elevated blood-pressure reading, without diagnosis of hypertension: Secondary | ICD-10-CM

## 2015-11-13 DIAGNOSIS — F32A Depression, unspecified: Secondary | ICD-10-CM

## 2015-11-13 DIAGNOSIS — Z7689 Persons encountering health services in other specified circumstances: Secondary | ICD-10-CM

## 2015-11-13 DIAGNOSIS — F329 Major depressive disorder, single episode, unspecified: Secondary | ICD-10-CM | POA: Diagnosis not present

## 2015-11-13 DIAGNOSIS — IMO0001 Reserved for inherently not codable concepts without codable children: Secondary | ICD-10-CM

## 2015-11-13 DIAGNOSIS — Z7189 Other specified counseling: Secondary | ICD-10-CM

## 2015-11-13 NOTE — Patient Instructions (Addendum)
It was great meeting you today  I will send in a medication called Indomethacin, take this as directed.   Follow up with me for your physical.   Really start watching your diet and start exercising.   Stop the energy drinks  Health Maintenance, Male A healthy lifestyle and preventative care can promote health and wellness.  Maintain regular health, dental, and eye exams.  Eat a healthy diet. Foods like vegetables, fruits, whole grains, low-fat dairy products, and lean protein foods contain the nutrients you need and are low in calories. Decrease your intake of foods high in solid fats, added sugars, and salt. Get information about a proper diet from your health care provider, if necessary.  Regular physical exercise is one of the most important things you can do for your health. Most adults should get at least 150 minutes of moderate-intensity exercise (any activity that increases your heart rate and causes you to sweat) each week. In addition, most adults need muscle-strengthening exercises on 2 or more days a week.   Maintain a healthy weight. The body mass index (BMI) is a screening tool to identify possible weight problems. It provides an estimate of body fat based on height and weight. Your health care provider can find your BMI and can help you achieve or maintain a healthy weight. For males 20 years and older:  A BMI below 18.5 is considered underweight.  A BMI of 18.5 to 24.9 is normal.  A BMI of 25 to 29.9 is considered overweight.  A BMI of 30 and above is considered obese.  Maintain normal blood lipids and cholesterol by exercising and minimizing your intake of saturated fat. Eat a balanced diet with plenty of fruits and vegetables. Blood tests for lipids and cholesterol should begin at age 17 and be repeated every 5 years. If your lipid or cholesterol levels are high, you are over age 29, or you are at high risk for heart disease, you may need your cholesterol levels checked  more frequently.Ongoing high lipid and cholesterol levels should be treated with medicines if diet and exercise are not working.  If you smoke, find out from your health care provider how to quit. If you do not use tobacco, do not start.  Lung cancer screening is recommended for adults aged 55-80 years who are at high risk for developing lung cancer because of a history of smoking. A yearly low-dose CT scan of the lungs is recommended for people who have at least a 30-pack-year history of smoking and are current smokers or have quit within the past 15 years. A pack year of smoking is smoking an average of 1 pack of cigarettes a day for 1 year (for example, a 30-pack-year history of smoking could mean smoking 1 pack a day for 30 years or 2 packs a day for 15 years). Yearly screening should continue until the smoker has stopped smoking for at least 15 years. Yearly screening should be stopped for people who develop a health problem that would prevent them from having lung cancer treatment.  If you choose to drink alcohol, do not have more than 2 drinks per day. One drink is considered to be 12 oz (360 mL) of beer, 5 oz (150 mL) of wine, or 1.5 oz (45 mL) of liquor.  Avoid the use of street drugs. Do not share needles with anyone. Ask for help if you need support or instructions about stopping the use of drugs.  High blood pressure causes heart disease  and increases the risk of stroke. High blood pressure is more likely to develop in:  People who have blood pressure in the end of the normal range (100-139/85-89 mm Hg).  People who are overweight or obese.  People who are African American.  If you are 10618-44 years of age, have your blood pressure checked every 3-5 years. If you are 640 years of age or older, have your blood pressure checked every year. You should have your blood pressure measured twice--once when you are at a hospital or clinic, and once when you are not at a hospital or clinic. Record  the average of the two measurements. To check your blood pressure when you are not at a hospital or clinic, you can use:  An automated blood pressure machine at a pharmacy.  A home blood pressure monitor.  If you are 7545-44 years old, ask your health care provider if you should take aspirin to prevent heart disease.  Diabetes screening involves taking a blood sample to check your fasting blood sugar level. This should be done once every 3 years after age 44 if you are at a normal weight and without risk factors for diabetes. Testing should be considered at a younger age or be carried out more frequently if you are overweight and have at least 1 risk factor for diabetes.  Colorectal cancer can be detected and often prevented. Most routine colorectal cancer screening begins at the age of 44 and continues through age 44. However, your health care provider may recommend screening at an earlier age if you have risk factors for colon cancer. On a yearly basis, your health care provider may provide home test kits to check for hidden blood in the stool. A small camera at the end of a tube may be used to directly examine the colon (sigmoidoscopy or colonoscopy) to detect the earliest forms of colorectal cancer. Talk to your health care provider about this at age 44 when routine screening begins. A direct exam of the colon should be repeated every 5-10 years through age 10375, unless early forms of precancerous polyps or small growths are found.  People who are at an increased risk for hepatitis B should be screened for this virus. You are considered at high risk for hepatitis B if:  You were born in a country where hepatitis B occurs often. Talk with your health care provider about which countries are considered high risk.  Your parents were born in a high-risk country and you have not received a shot to protect against hepatitis B (hepatitis B vaccine).  You have HIV or AIDS.  You use needles to inject  street drugs.  You live with, or have sex with, someone who has hepatitis B.  You are a man who has sex with other men (MSM).  You get hemodialysis treatment.  You take certain medicines for conditions like cancer, organ transplantation, and autoimmune conditions.  Hepatitis C blood testing is recommended for all people born from [birth year] through 1965 and any individual with known risk factors for hepatitis C.  Healthy men should no longer receive prostate-specific antigen (PSA) blood tests as part of routine cancer screening. Talk to your health care provider about prostate cancer screening.  Testicular cancer screening is not recommended for adolescents or adult males who have no symptoms. Screening includes self-exam, a health care provider exam, and other screening tests. Consult with your health care provider about any symptoms you have or any concerns you have about testicular cancer.  Practice safe sex. Use condoms and avoid high-risk sexual practices to reduce the spread of sexually transmitted infections (STIs).  You should be screened for STIs, including gonorrhea and chlamydia if:  You are sexually active and are younger than 24 years.  You are older than 24 years, and your health care provider tells you that you are at risk for this type of infection.  Your sexual activity has changed since you were last screened, and you are at an increased risk for chlamydia or gonorrhea. Ask your health care provider if you are at risk.  If you are at risk of being infected with HIV, it is recommended that you take a prescription medicine daily to prevent HIV infection. This is called pre-exposure prophylaxis (PrEP). You are considered at risk if:  You are a man who has sex with other men (MSM).  You are a heterosexual man who is sexually active with multiple partners.  You take drugs by injection.  You are sexually active with a partner who has HIV.  Talk with your health care provider  about whether you are at high risk of being infected with HIV. If you choose to begin PrEP, you should first be tested for HIV. You should then be tested every 3 months for as long as you are taking PrEP.  Use sunscreen. Apply sunscreen liberally and repeatedly throughout the day. You should seek shade when your shadow is shorter than you. Protect yourself by wearing long sleeves, pants, a wide-brimmed hat, and sunglasses year round whenever you are outdoors.  Tell your health care provider of new moles or changes in moles, especially if there is a change in shape or color. Also, tell your health care provider if a mole is larger than the size of a pencil eraser.  A one-time screening for abdominal aortic aneurysm (AAA) and surgical repair of large AAAs by ultrasound is recommended for men aged 23-75 years who are current or former smokers.  Stay current with your vaccines (immunizations).   This information is not intended to replace advice given to you by your health care provider. Make sure you discuss any questions you have with your health care provider.   Document Released: 12/05/2007 Document Revised: 06/29/2014 Document Reviewed: 11/03/2010 Elsevier Interactive Patient Education [employer].

## 2015-11-13 NOTE — Progress Notes (Signed)
Patient presents to clinic today to establish care. He is a pleasant 45 year old male who  has a past medical history of Depression; GERD (gastroesophageal reflux disease); Mood swings (HCC); Dental crowns present; Chronic lower back pain; Fracture, nonunion (07/2013); Painful orthopaedic hardware (07/2013); Gout; Alcohol abuse; and Elevated liver enzymes.   Acute Concerns: Establish Care  Chronic Issues: Depression - Feels as though it is controlled well  Gout - Currently feels as though he has a gout flare in his left great toe. Has not been taking any medication for this. He gets a flare once every 6 months to a year.   Hypertension  - He reports that he drank a couple of Red Bulls this morning. Has never been on medication for high blood pressure   Health Maintenance: Dental -- Has not seen " in awhile"  Vision -- Does not see on a regular basis.  Immunizations -- Refused  Colonoscopy -- Has an appointment this summer   Diet: Does not eat healthy. Lunch from convenience store and has red meat almost every night for dinner Exercise: Does not exercise.   Past Medical History  Diagnosis Date  . Depression   . GERD (gastroesophageal reflux disease)     occasional - no current med.  . Mood swings (HCC)   . Dental crowns present   . Chronic lower back pain   . Fracture, nonunion 07/2013    right foot talonavicular nonunion  . Painful orthopaedic hardware 07/2013    right foot  . Gout   . Alcohol abuse   . Elevated liver enzymes     Past Surgical History  Procedure Laterality Date  . Eye muscle surgery      x 2 - as a child  . Wisdom tooth extraction    . Foot arthrodesis Right 08/25/2012    Procedure: Right Talonavicular Arthrodesis, ;  Surgeon: Toni ArthursJohn Hewitt, MD;  Location: [facility];  Service: Orthopedics;  Laterality: Right;  Right Talonavicular Arthrodesis,  . Gastroc recession extremity Right 08/25/2012    Procedure:  Right Gastroc Recession;   Surgeon: Toni ArthursJohn Hewitt, MD;  Location: [facility];  Service: Orthopedics;  Laterality: Right;  . Hardware removal Right 08/03/2013    Procedure: HARDWARE REMOVAL;  Surgeon: Toni ArthursJohn Hewitt, MD;  Location: [facility];  Service: Orthopedics;  Laterality: Right;  . Foot arthrodesis Right 08/03/2013    Procedure: RIGHT FOOT REMOVAL OF HARDWARE, AUTOGRAFT BONE TO TALONAVICULAR JOINT, REVISION OF TALONAVICULAR AND SUBTALAR ARTHRODESIS ;  Surgeon: Toni ArthursJohn Hewitt, MD;  Location: [facility];  Service: Orthopedics;  Laterality: Right;  . Foot surgery Right March 2014  . Foot surgery Right January 2015  . Neck surgery  May 22 2015    Current Outpatient Prescriptions on File Prior to Visit  Medication Sig Dispense Refill  . FLUoxetine (PROZAC) 20 MG capsule Take 20 mg by mouth every morning.     No current facility-administered medications on file prior to visit.    No Known Allergies  Family History  Problem Relation Age of Onset  . Alcohol abuse Mother   . Cancer Mother     Lung  . Alcohol abuse Father   . Breast cancer Maternal Grandmother     Social History   Social History  . Marital Status: Married    Spouse Name: N/A  . Number of Children: N/A  . Years of Education: N/A   Occupational History  . Not on file.  Social History Main Topics  . Smoking status: Former Smoker    Quit date: 08/23/2009  . Smokeless tobacco: Never Used  . Alcohol Use: 0.0 oz/week    0 Standard drinks or equivalent per week     Comment: Quit drinking in December 2016   . Drug Use: Yes     Comment: percocet, roxicet  . Sexual Activity: Yes   Other Topics Concern  . Not on file   Social History Narrative   He sells life insurance.    Married for 10 years   No kids          Review of Systems  Constitutional: Negative.   HENT: Negative.   Respiratory: Negative.   Cardiovascular: Negative.   Gastrointestinal: Negative.   Genitourinary: Negative.     Neurological: Negative.   Psychiatric/Behavioral: Negative.   All other systems reviewed and are negative.   BP 150/90 mmHg  Temp(Src) 98.1 F (36.7 C) (Oral)  Ht  (1.854 m)  Wt 287 lb 11.2 oz (130.5 kg)  BMI 37.97 kg/m2  Physical Exam  Constitutional: He is oriented to person, place, and time and well-developed, well-nourished, and in no distress. No distress.  obese  HENT:  Head: Normocephalic and atraumatic.  Right Ear: External ear normal.  Left Ear: External ear normal.  Nose: Nose normal.  Mouth/Throat: Oropharynx is clear and moist. No oropharyngeal exudate.  Eyes: Conjunctivae are normal. Pupils are equal, round, and reactive to light. Right eye exhibits no discharge. Left eye exhibits no discharge.  Neck: Normal range of motion. Neck supple. No tracheal deviation present. No thyromegaly present.  Cardiovascular: Normal rate, regular rhythm, normal heart sounds and intact distal pulses.  Exam reveals no gallop and no friction rub.   No murmur heard. Pulmonary/Chest: Effort normal and breath sounds normal. No respiratory distress. He has no wheezes. He has no rales. He exhibits no tenderness.  Musculoskeletal: He exhibits edema.  Trace non pitting edema in right leg from prior surgeries.   Lymphadenopathy:    He has no cervical adenopathy.  Neurological: He is alert and oriented to person, place, and time. He displays normal reflexes. No cranial nerve deficit. He exhibits normal muscle tone. Gait normal. Coordination normal. GCS score is 15.  Skin: Skin is warm and dry. No rash noted. He is not diaphoretic. No erythema. No pallor.  Psychiatric: Mood, memory, affect and judgment normal.  Nursing note and vitals reviewed.   Assessment/Plan:  1. Encounter to establish care - Follow up for CPE - Follow up sooner if needed - He needs to eat healthier and exercise. His goal weight is under 250 lbs  2. Depression - Controlled on current medication   3. Elevated  blood pressure - Will recheck at physical  - Needs to change diet, exercise and lose weight.  - stop drinking energy drinks.  - Consider adding antihypertensive during next visit.   Shirline Frees, NP

## 2015-12-26 ENCOUNTER — Ambulatory Visit (INDEPENDENT_AMBULATORY_CARE_PROVIDER_SITE_OTHER): Payer: BLUE CROSS/BLUE SHIELD | Admitting: Internal Medicine

## 2015-12-26 ENCOUNTER — Encounter: Payer: Self-pay | Admitting: Internal Medicine

## 2015-12-26 VITALS — BP 126/80 | HR 80 | Ht 72.0 in | Wt 289.4 lb

## 2015-12-26 DIAGNOSIS — E669 Obesity, unspecified: Secondary | ICD-10-CM | POA: Diagnosis not present

## 2015-12-26 DIAGNOSIS — R740 Nonspecific elevation of levels of transaminase and lactic acid dehydrogenase [LDH]: Secondary | ICD-10-CM

## 2015-12-26 DIAGNOSIS — R748 Abnormal levels of other serum enzymes: Secondary | ICD-10-CM

## 2015-12-26 DIAGNOSIS — K760 Fatty (change of) liver, not elsewhere classified: Secondary | ICD-10-CM | POA: Diagnosis not present

## 2015-12-26 NOTE — Patient Instructions (Signed)
  Please find out if your Dad had colon cancer and let us know.    Continue to avoid alcohol.    Today we are giving you handouts to read on fatty liver and the effects of alcohol on the body.    I appreciate the opportunity to care for you. Stan Headarl Gessner, MD, [facility]

## 2015-12-27 ENCOUNTER — Encounter: Payer: Self-pay | Admitting: Internal Medicine

## 2015-12-27 NOTE — Progress Notes (Signed)
Subjective:    Patient ID: Andre AuerbachWilliam Bardon Blevins, male    DOB: 1970-10-07, 45 y.o.   MRN: [id number] Cc: ? Colonoscopy - FHx colon "problems" HPI  No active GI problems (gut) but says dad had colon surgery w/ colostomy at age 44. No definite cancer - patient does not know actual dx. His dad suggested "I get my colon checked out"  Has hx excessive EtOH use in 20's and some ongoing use, hx fattly liver and abnl transaminases. No EtOH x 1+ month.  No Known Allergies Outpatient Prescriptions Prior to Visit  Medication Sig Dispense Refill  . FLUoxetine (PROZAC) 20 MG capsule Take 20 mg by mouth every morning.    . SUBOXONE 8-2 MG FILM   0   No facility-administered medications prior to visit.   Past Medical History  Diagnosis Date  . Depression   . GERD (gastroesophageal reflux disease)     occasional - no current med.  . Mood swings (HCC)   . Dental crowns present   . Chronic lower back pain   . Fracture, nonunion 07/2013    right foot talonavicular nonunion  . Painful orthopaedic hardware 07/2013    right foot  . Gout   . Alcohol abuse   . Elevated liver enzymes   . Narcotic overdose 2016   Past Surgical History  Procedure Laterality Date  . Eye muscle surgery Bilateral     x 2 - as a child  . Wisdom tooth extraction    . Foot arthrodesis Right 08/25/2012    Procedure: Right Talonavicular Arthrodesis, ;  Surgeon: Toni ArthursJohn Hewitt, MD;  Location: [facility];  Service: Orthopedics;  Laterality: Right;  Right Talonavicular Arthrodesis,  . Gastroc recession extremity Right 08/25/2012    Procedure:  Right Gastroc Recession;  Surgeon: Toni ArthursJohn Hewitt, MD;  Location: [facility];  Service: Orthopedics;  Laterality: Right;  . Hardware removal Right 08/03/2013    Procedure: HARDWARE REMOVAL;  Surgeon: Toni ArthursJohn Hewitt, MD;  Location: [facility];  Service: Orthopedics;  Laterality: Right;  . Foot arthrodesis Right 08/03/2013    Procedure: RIGHT FOOT REMOVAL  OF HARDWARE, AUTOGRAFT BONE TO TALONAVICULAR JOINT, REVISION OF TALONAVICULAR AND SUBTALAR ARTHRODESIS ;  Surgeon: Toni ArthursJohn Hewitt, MD;  [facility];  Service: Orthopedics;  Laterality: Right;  . Neck surgery  May 22 2015   Social History   Social History  . Marital Status: Married    Spouse Name: N/A  . Number of Children: 0  . Years of Education: N/A   Occupational History  . insurance sales    Social History Main Topics  . Smoking status: Former Smoker -- 3 years    Types: Cigarettes    Start date: 06/22/2006    Quit date: 08/23/2009  . Smokeless tobacco: Never Used  . Alcohol Use: 12.6 oz/week    21 Standard drinks or equivalent per week     Comment: 3 per day  . Drug Use: No     Comment: percocet, roxicet  . Sexual Activity: Yes   Other Topics Concern  . None   Social History Narrative   He sells life insurance.    Married for 10 years   No kids         Family History  Problem Relation Age of Onset  . Alcohol abuse Mother   . Lung cancer Mother   . Alcohol abuse Father   . Breast cancer Maternal Grandmother   . Colon polyps  Father    Review of Systems As above, dpression, insomnia all other negative    Objective:   Physical Exam @BP  126/80 mmHg  Pulse 80  Ht 6' (1.829 m)  Wt 289 lb 6 oz (131.26 kg)  BMI 39.24 kg/m2@  General:  Well-developed, well-nourished and in no acute distress - obese Eyes:  anicteric. ENT:   Mouth and posterior pharynx free of lesions.  Neck:   supple w/o thyromegaly or mass.  Lungs: Clear to auscultation bilaterally. Heart:  S1S2, no rubs, murmurs, gallops. Abdomen:  obese w/ striaesoft, non-tender, no hepatosplenomegaly, hernia, or mass and BS+.  Lymph:  no cervical or supraclavicular adenopathy. Extremities:   no edema, cyanosis or clubbing Skin   no rash. No stigmata of chronic liver disease Neuro:  A&O x 3.  Psych:  appropriate mood and  Affect.   Data Reviewed: Prior US, labs in EMR and  ED visits also     Assessment & Plan:   Encounter Diagnoses  Name Primary?  . Fatty liver Yes  . Abnormal transaminases   . 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 I explained that he should not drink EtOH and provided handouts on the risks of alcohol to body and liver.  He will find out exactly what his father had - does not sound like cancer and if not would do colon cancer screening test at age 45, not now.  He has labs upcoming w/ PCP - I suggest he also get screened for infectious Hep B and C and have another liver US.  I appreciate the opportunity to care for this patient. WU:[id number]:Cory Nafziger, NP

## 2016-01-15 ENCOUNTER — Other Ambulatory Visit: Payer: BLUE CROSS/BLUE SHIELD

## 2016-01-22 ENCOUNTER — Encounter: Payer: BLUE CROSS/BLUE SHIELD | Admitting: Adult Health

## 2016-01-22 DIAGNOSIS — Z0289 Encounter for other administrative examinations: Secondary | ICD-10-CM

## 2022-08-10 IMAGING — MR MRI LUMBAR SPINE WITHOUT CONTRAST
4 series · 48 of 48 positions shown · IV contrast (Off)
Comparison: none

MRI OF THE LUMBAR SPINE WITHOUT CONTRAST
CLINICAL HISTORY: Chronic low back pain, right radiculopathy.
TECHNIQUE: Multisequential multiplanar imaging was performed of the lumbar spinal region.

[Series 3: T2 · sagittal · 5.0mm · 1.25mm/px · 10 of 15 slices shown (1 of 2)]
[im 1/15]
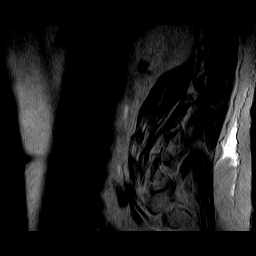
[im 2/15]
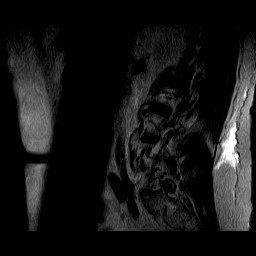
[im 4/15]
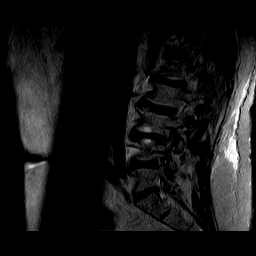
[im 5/15]
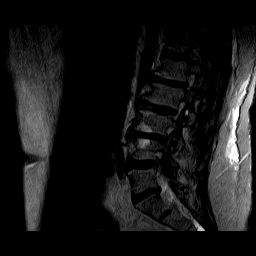
[im 7/15]
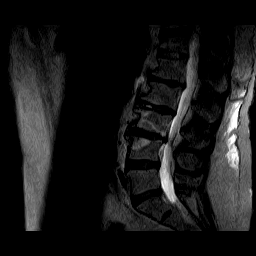
[im 8/15]
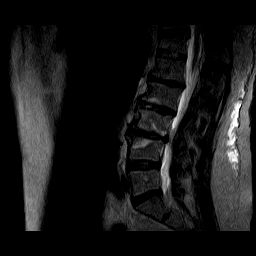
[im 10/15]
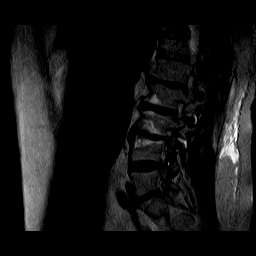
[im 11/15]
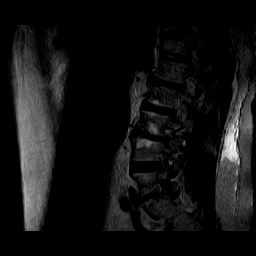
[im 13/15]
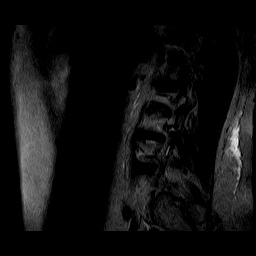
[im 15/15]
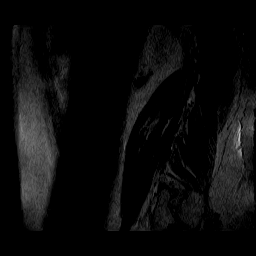

[Series 4: T1 · sagittal · 5.0mm · 1.25mm/px · 10 of 15 slices shown]
[im 1/15]
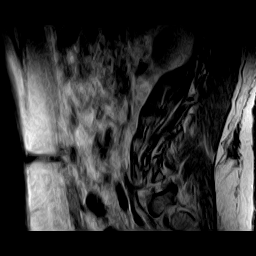
[im 2/15]
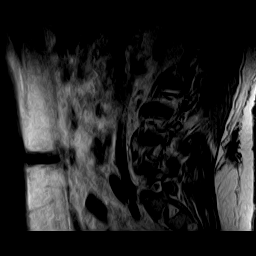
[im 4/15]
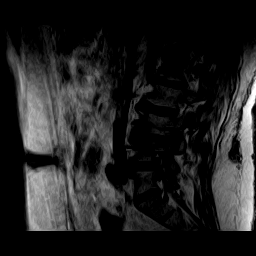
[im 5/15]
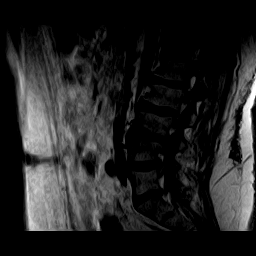
[im 7/15]
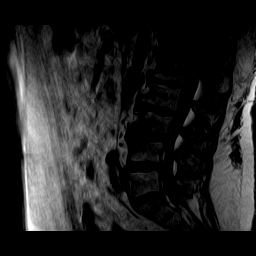
[im 8/15]
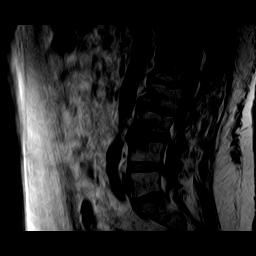
[im 10/15]
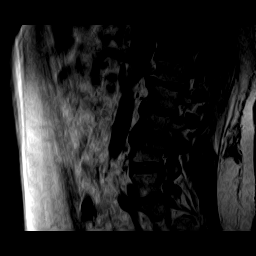
[im 11/15]
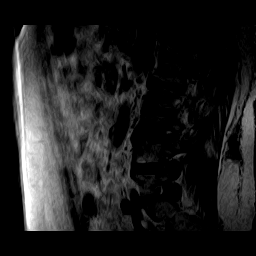
[im 13/15]
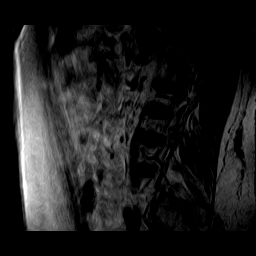
[im 15/15]
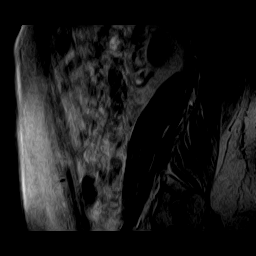

[Series 6: T2 · axial · 5.0mm · 1.17mm/px · z∈[-50,+112]mm · 18 of 28 slices shown (2 of 2)]
[im 1/28]
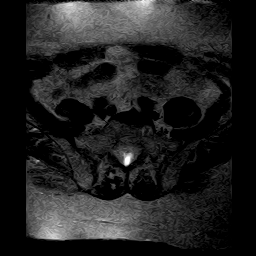
[im 2/28]
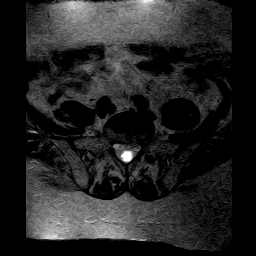
[im 4/28]
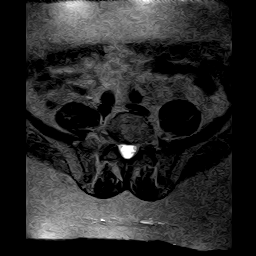
[im 5/28]
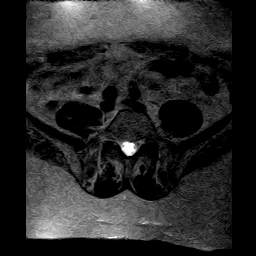
[im 7/28]
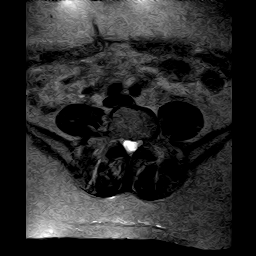
[im 8/28]
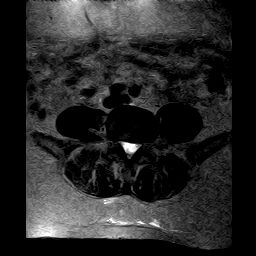
[im 10/28]
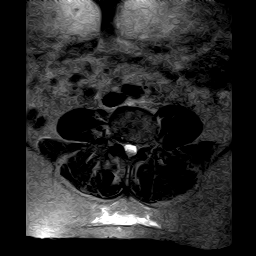
[im 12/28]
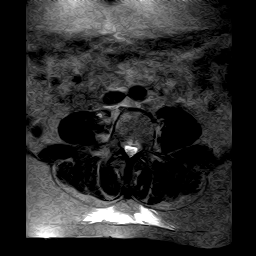
[im 13/28]
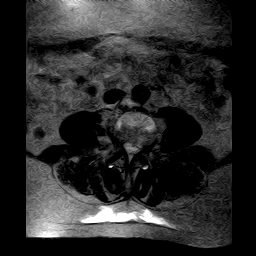
[im 15/28]
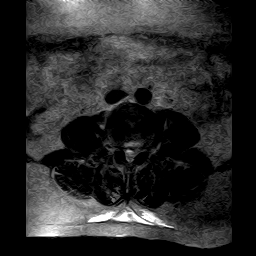
[im 16/28]
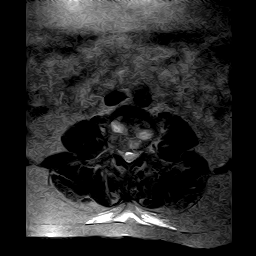
[im 18/28]
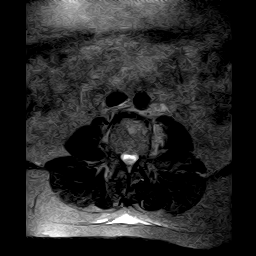
[im 20/28]
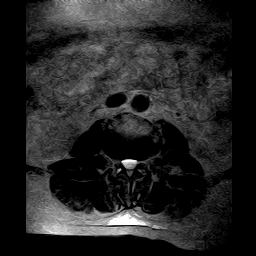
[im 21/28]
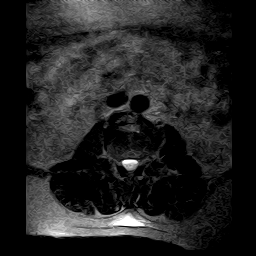
[im 23/28]
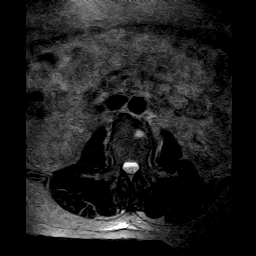
[im 24/28]
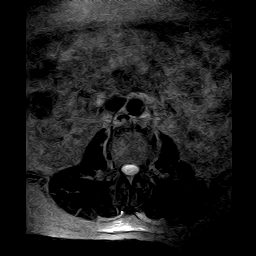
[im 26/28]
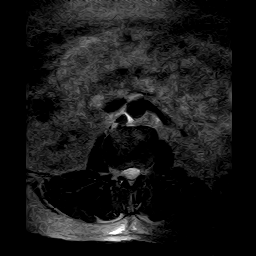
[im 28/28]
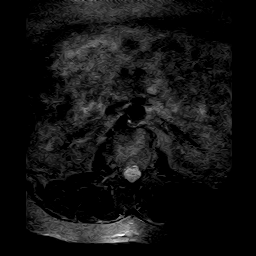

[Series 7: ir sag xlg · sagittal · 5.0mm · 1.25mm/px · 10 of 15 slices shown]
[im 1/15]
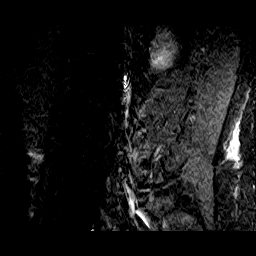
[im 2/15]
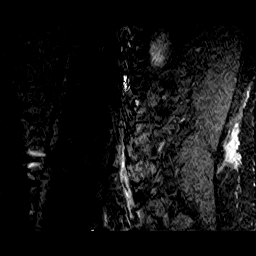
[im 4/15]
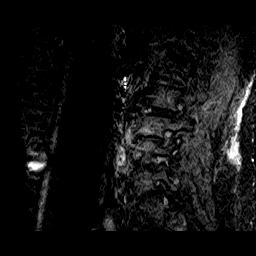
[im 5/15]
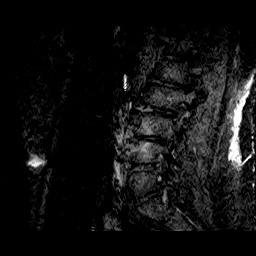
[im 7/15]
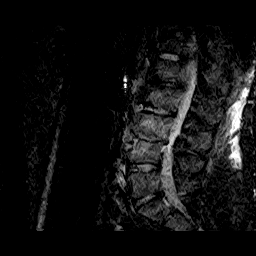
[im 8/15]
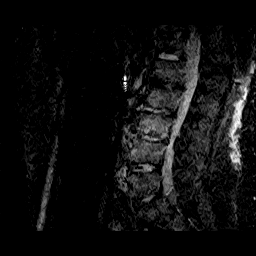
[im 10/15]
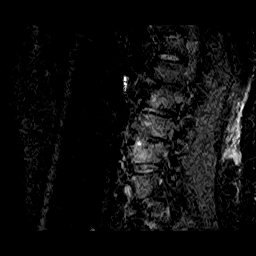
[im 11/15]
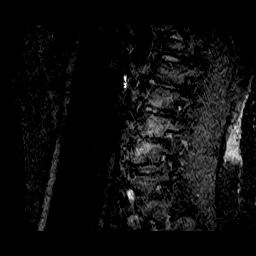
[im 13/15]
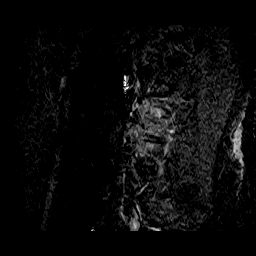
[im 15/15]
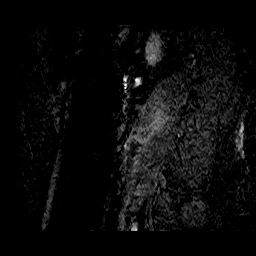

[48 of 48 positions shown; findings below may reference images not displayed]

FINDINGS: There is a normal marrow signal noted throughout the lumbar vertebral bodies. The conus medullaris is unremarkable and there is no obvious intradural abnormality noted. There is moderate facet arthrosis and ligamentum flavum hypertrophy throughout the lumbar spinal region. There is a compression fracture of T12 with approximately 55-60% loss of height along the superior endplate and anteriorly. No marrow edema. 

At L1-L2 level, there is narrowing and anterior, posterior, and lateral osteophyte. 

At L2-L3 level, there is narrowing, anterior, posterior, and lateral osteophyte, and endplate reactive changes. 

At L3-L4 level, there is narrowing, anterior, posterior, and lateral osteophyte, endplate reactive changes, and 2 mm bulging. Moderate spinal stenosis and moderate bilateral neural foraminal narrowing. 

At L4-L5 level, there is moderate right and mild to moderate left neural foraminal narrowing. 

At L5-S1 level, there is anterior, posterior, and lateral osteophyte and moderate right neural foraminal narrowing.
IMPRESSION: 1.
Moderate spinal stenosis at L3-L4 including narrowing, anterior, posterior, and lateral osteophyte, endplate reactive changes, and 2 mm bulging. Narrowing and anterior, posterior, and lateral osteophyte at L1-L2, narrowing, anterior, posterior, and lateral osteophyte, and endplate reactive changes at L2-L3, and anterior, posterior, and lateral osteophyte at L5-S1. Moderate facet arthrosis and ligamentum flavum hypertrophy throughout. 

2.
Lateral osteophyte and facet arthrosis are causing moderate bilateral neural foraminal narrowing at L3-L4, moderate right and mild to moderate left neural foraminal narrowing at L4-L5, and moderate right neural foraminal narrowing at L5-S1. 

3.
Compression fracture of T12 with approximately 55-60% loss of height along the superior endplate and anteriorly. No marrow edema present suggests this is a chronic long-standing finding.

## 2023-07-01 IMAGING — MR MRI LUMBAR SPINE WITHOUT CONTRAST
4 series · 48 of 48 positions shown · IV contrast (Off)
Comparison: Examination compared to prior examination from our facility dated 08/10/2022.

MRI OF THE LUMBAR SPINE WITHOUT CONTRAST
CLINICAL HISTORY: Low back pain, severe right buttock pain.
TECHNIQUE: Multisequential multiplanar imaging was performed of the lumbar spinal region.

[Series 3: T2 · sagittal · 5.0mm · 1.25mm/px · 10 of 13 slices shown (1 of 2)]
[im 1/13]
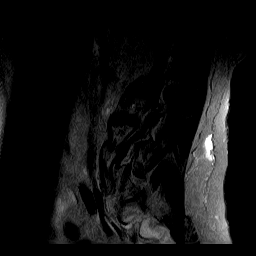
[im 2/13]
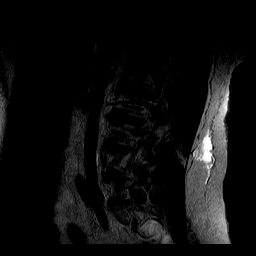
[im 3/13]
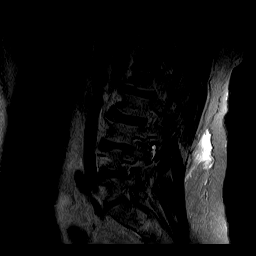
[im 5/13]
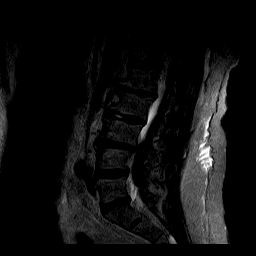
[im 6/13]
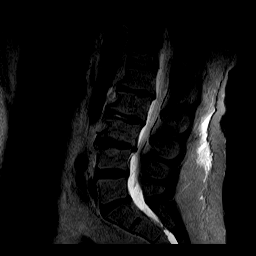
[im 7/13]
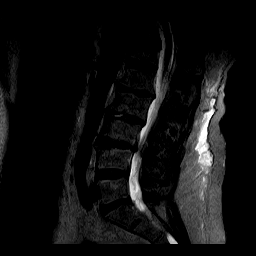
[im 9/13]
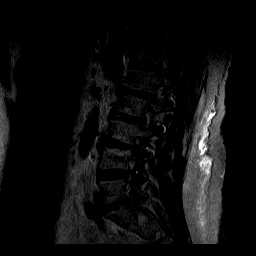
[im 10/13]
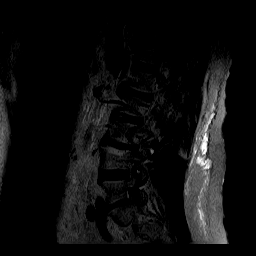
[im 11/13]
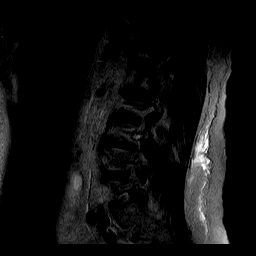
[im 13/13]
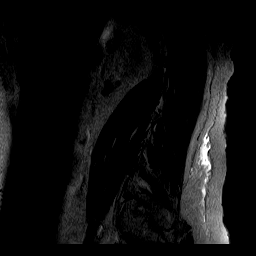

[Series 4: T1 · sagittal · 5.0mm · 1.25mm/px · 9 of 13 slices shown]
[im 1/13]
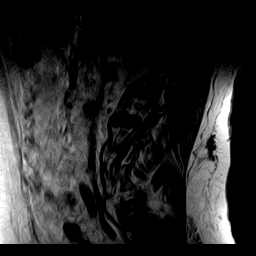
[im 2/13]
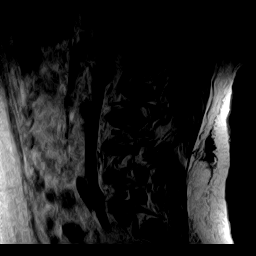
[im 4/13]
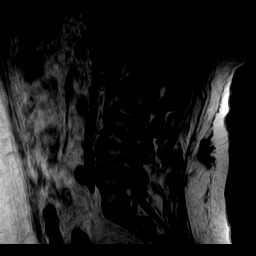
[im 5/13]
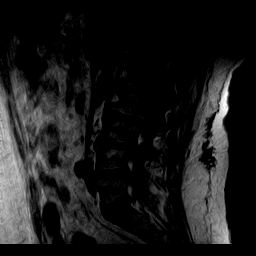
[im 7/13]
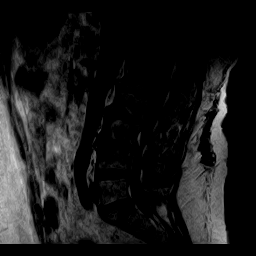
[im 8/13]
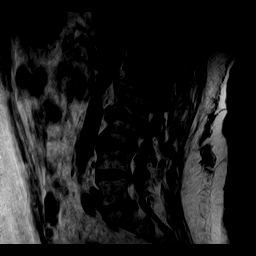
[im 10/13]
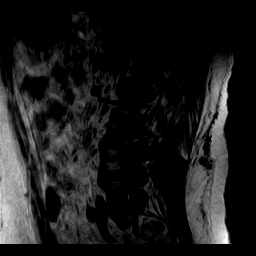
[im 11/13]
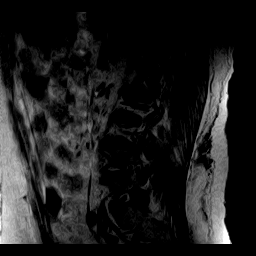
[im 13/13]
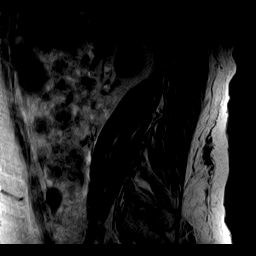

[Series 6: T2 · axial · 5.0mm · 1.17mm/px · z∈[-116,+139]mm · 20 of 28 slices shown (2 of 2)]
[im 1/28]
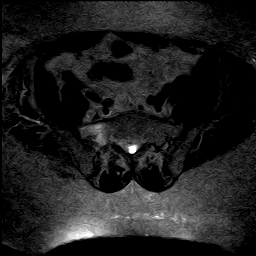
[im 2/28]
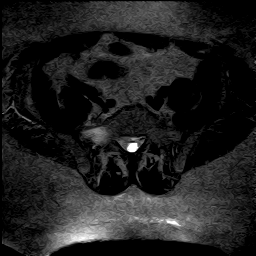
[im 3/28]
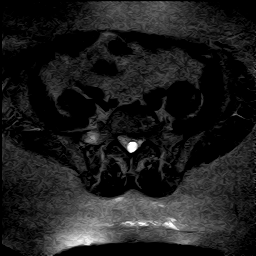
[im 5/28]
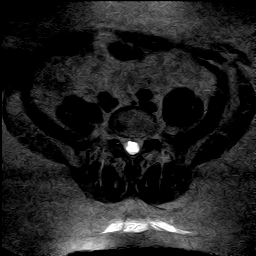
[im 6/28]
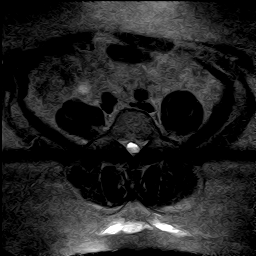
[im 8/28]
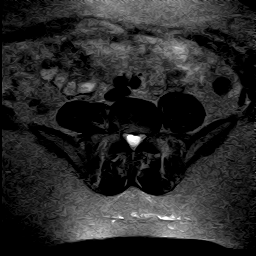
[im 9/28]
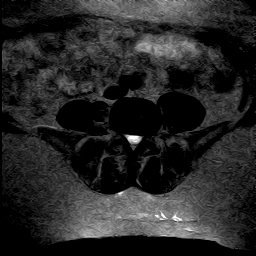
[im 10/28]
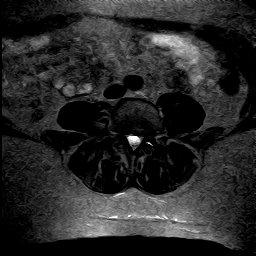
[im 12/28]
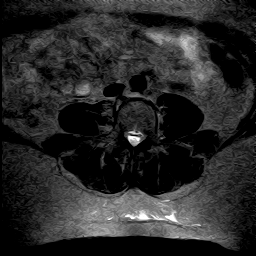
[im 13/28]
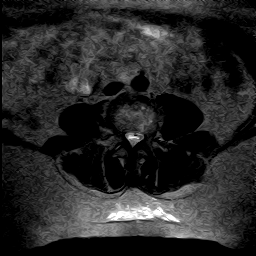
[im 15/28]
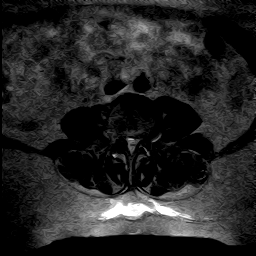
[im 16/28]
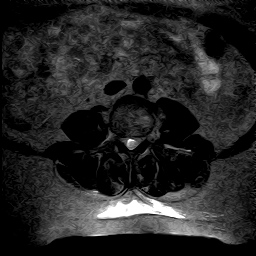
[im 18/28]
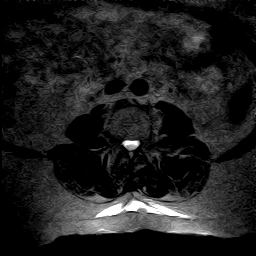
[im 19/28]
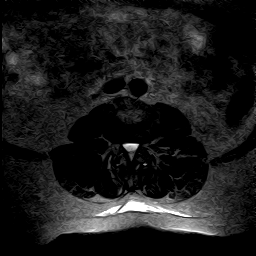
[im 20/28]
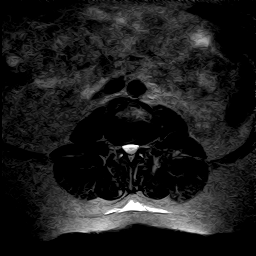
[im 22/28]
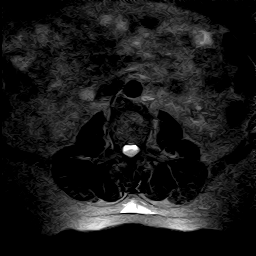
[im 23/28]
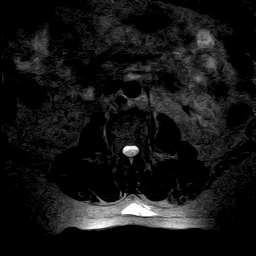
[im 25/28]
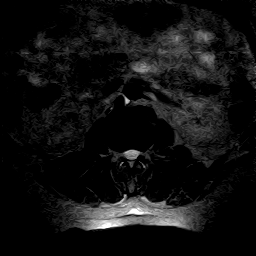
[im 26/28]
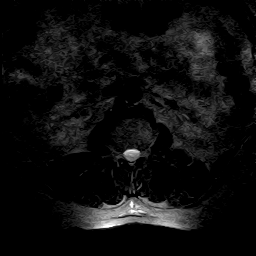
[im 28/28]
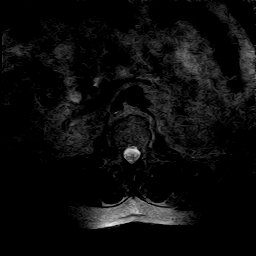

[Series 7: ir sag xlg · sagittal · 5.0mm · 1.25mm/px · 9 of 13 slices shown]
[im 1/13]
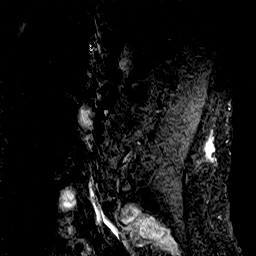
[im 2/13]
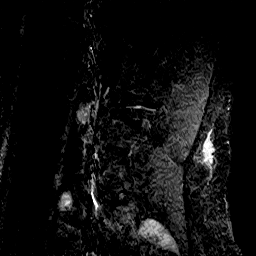
[im 4/13]
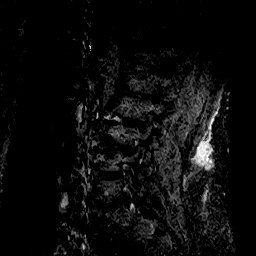
[im 5/13]
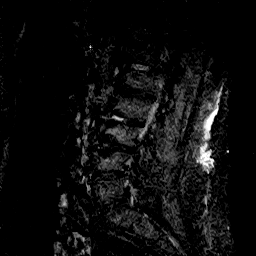
[im 7/13]
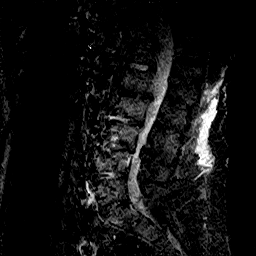
[im 8/13]
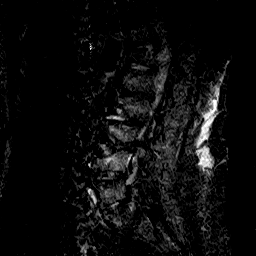
[im 10/13]
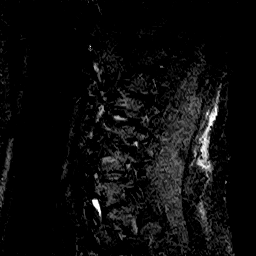
[im 11/13]
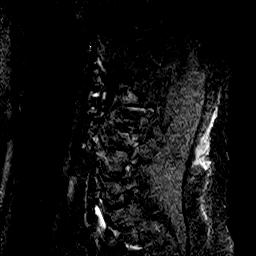
[im 13/13]
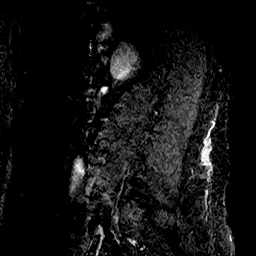

[48 of 48 positions shown; findings below may reference images not displayed]

FINDINGS: There is a normal marrow signal noted throughout the lumbar vertebral bodies. The conus medullaris is unremarkable and there is no obvious intradural abnormality noted. Again noted is moderate facet arthrosis throughout the lumbar spinal region. Compression fracture of T12 with approximately 55-60% loss of height, which appears stable. 

At T11-T12 level, there is narrowing and anterior, posterior, and lateral osteophyte. 

At L1-L2 level, there is narrowing and anterior, posterior, and lateral osteophyte. 

At L2-L3 level, there is narrowing and anterior, posterior, and lateral osteophyte. 

At L3-L4 level, there is narrowing, anterior, posterior, and lateral osteophyte, endplate reactive changes, and moderate spinal stenosis. Moderate bilateral neural foraminal narrowing. 

At L4-L5 level, there is moderate right and mild to moderate left neural foraminal narrowing. 

At L5-S1 level, there is anterior, posterior, and lateral osteophyte, 1.5 mm retrolisthesis, and 2.5 mm broad-based herniation. Moderate right neural foraminal narrowing.
IMPRESSION: 1.
Narrowing and anterior, posterior, and lateral osteophyte at T11-T12, L1-L2, and L2-L3. Narrowing, anterior, posterior, and lateral osteophyte, and endplate reactive changes at L3-L4 with moderate spinal stenosis. Visually these findings are unchanged. At L5-S1, there is anterior, posterior, and lateral osteophyte, 1.5 mm retrolisthesis, and 2.5 mm broad-based herniation. These findings appear to have progressed since prior examination. 

2.
Lateral osteophyte and facet arthrosis are causing moderate bilateral neural foraminal narrowing at L3-L4, moderate right and mild to moderate left neural foraminal narrowing at L4-L5, and moderate right neural foraminal narrowing at L5-S1. This appears unchanged. 

3.
Compression fracture of T12 with approximately 55-60% loss of height. Visually this is unchanged and there is no marrow edema present.
